# Patient Record
Sex: Female | Born: 1950 | Race: White | Hispanic: No | Marital: Married | Smoking: Former smoker
Health system: Southern US, Community
[De-identification: ages and names within clinical notes are randomized; demographics above are authoritative.]

## PROBLEM LIST (undated history)

## (undated) DIAGNOSIS — M5126 Other intervertebral disc displacement, lumbar region: Secondary | ICD-10-CM

## (undated) DIAGNOSIS — R51 Headache: Secondary | ICD-10-CM

## (undated) DIAGNOSIS — R0602 Shortness of breath: Secondary | ICD-10-CM

## (undated) DIAGNOSIS — M1990 Unspecified osteoarthritis, unspecified site: Secondary | ICD-10-CM

## (undated) DIAGNOSIS — R0789 Other chest pain: Secondary | ICD-10-CM

## (undated) DIAGNOSIS — I10 Essential (primary) hypertension: Secondary | ICD-10-CM

## (undated) HISTORY — DX: Unspecified osteoarthritis, unspecified site: M19.90

## (undated) HISTORY — DX: Essential (primary) hypertension: I10

## (undated) HISTORY — DX: Other intervertebral disc displacement, lumbar region: M51.26

## (undated) HISTORY — PX: SPINE SURGERY: SHX786

## (undated) HISTORY — PX: COLONOSCOPY: SHX174

## (undated) HISTORY — DX: Other chest pain: R07.89

---

## 1997-08-16 ENCOUNTER — Other Ambulatory Visit: Admission: RE | Admit: 1997-08-16 | Discharge: 1997-08-16 | Payer: Self-pay | Admitting: Obstetrics and Gynecology

## 2000-03-12 ENCOUNTER — Encounter: Payer: Self-pay | Admitting: Internal Medicine

## 2000-03-12 ENCOUNTER — Encounter: Admission: RE | Admit: 2000-03-12 | Discharge: 2000-03-12 | Payer: Self-pay | Admitting: Internal Medicine

## 2001-03-13 ENCOUNTER — Encounter: Payer: Self-pay | Admitting: Internal Medicine

## 2001-03-13 ENCOUNTER — Encounter: Admission: RE | Admit: 2001-03-13 | Discharge: 2001-03-13 | Payer: Self-pay | Admitting: Internal Medicine

## 2002-03-25 ENCOUNTER — Encounter: Payer: Self-pay | Admitting: Internal Medicine

## 2002-03-25 ENCOUNTER — Encounter: Admission: RE | Admit: 2002-03-25 | Discharge: 2002-03-25 | Payer: Self-pay | Admitting: Internal Medicine

## 2003-03-29 ENCOUNTER — Encounter: Admission: RE | Admit: 2003-03-29 | Discharge: 2003-03-29 | Payer: Self-pay | Admitting: Internal Medicine

## 2005-09-12 LAB — PULMONARY FUNCTION TEST

## 2006-09-09 ENCOUNTER — Ambulatory Visit: Payer: Self-pay | Admitting: Cardiology

## 2006-10-10 ENCOUNTER — Ambulatory Visit: Payer: Self-pay | Admitting: Gastroenterology

## 2006-10-25 ENCOUNTER — Ambulatory Visit: Payer: Self-pay | Admitting: Gastroenterology

## 2006-10-25 ENCOUNTER — Encounter: Payer: Self-pay | Admitting: Gastroenterology

## 2009-03-25 ENCOUNTER — Encounter: Admission: RE | Admit: 2009-03-25 | Discharge: 2009-03-25 | Payer: Self-pay | Admitting: Internal Medicine

## 2009-09-26 ENCOUNTER — Encounter: Payer: Self-pay | Admitting: Cardiology

## 2010-06-13 ENCOUNTER — Encounter: Payer: Self-pay | Admitting: Cardiology

## 2010-06-15 DIAGNOSIS — Z87891 Personal history of nicotine dependence: Secondary | ICD-10-CM | POA: Insufficient documentation

## 2010-06-15 DIAGNOSIS — Z9189 Other specified personal risk factors, not elsewhere classified: Secondary | ICD-10-CM | POA: Insufficient documentation

## 2010-06-15 DIAGNOSIS — M19049 Primary osteoarthritis, unspecified hand: Secondary | ICD-10-CM | POA: Insufficient documentation

## 2010-06-20 ENCOUNTER — Encounter: Payer: Self-pay | Admitting: Cardiology

## 2010-06-20 ENCOUNTER — Ambulatory Visit
Admission: RE | Admit: 2010-06-20 | Discharge: 2010-06-20 | Payer: Self-pay | Source: Home / Self Care | Attending: Cardiology | Admitting: Cardiology

## 2010-06-20 DIAGNOSIS — R0789 Other chest pain: Secondary | ICD-10-CM | POA: Insufficient documentation

## 2010-06-20 DIAGNOSIS — R0602 Shortness of breath: Secondary | ICD-10-CM | POA: Insufficient documentation

## 2010-06-28 ENCOUNTER — Telehealth (INDEPENDENT_AMBULATORY_CARE_PROVIDER_SITE_OTHER): Payer: Self-pay | Admitting: *Deleted

## 2010-06-28 NOTE — Assessment & Plan Note (Signed)
Summary: f3y  Medications Added ALPRAZOLAM 0.5 MG TABS (ALPRAZOLAM) 1/2 tablet at night as needed PRILOSEC 20 MG CPDR (OMEPRAZOLE) Take 1 tablet by mouth two times a day      Allergies Added: ! SULFA ! NAPROSYN  Visit Type:  3 yr follow up Primary Provider:  Dr. Perrin Maltese  CC:  discomfort in neck and chest.  History of Present Illness: [name] comes today with chest and neck tightness and DOE.  The chest discomfort occurs with stress much of which she has experienced lately. A neighbor is sueing and accusing her dogs of killing her pot belly pig.  She has also been having increased DOE which is new. BP has been elevated recently. She also has GERD which is responding to two times a day Prilosec.  EKG from Dr Perrin Maltese office reviewed...no acute changes, essentially normal.  Clinical Reports Reviewed:  Nuclear Study:  07/22/2001:  Interpretation: Clinically negative for ischemia. Electrocardiographically negative for ischemia. Perfusion data does not demonstrate ischemia.   Lewayne Bunting, MD   Current Medications (verified): 1)  Alprazolam 0.5 Mg Tabs (Alprazolam) .... 1/2 Tablet At Night As Needed 2)  Prilosec 20 Mg Cpdr (Omeprazole) .... Take 1 Tablet By Mouth Two Times A Day  Allergies (verified): 1)  ! Sulfa 2)  ! Naprosyn  Past History:  Past Medical History: Last updated: 07/06/2010 CHEST PAIN, ATYPICAL, HX OF (ICD-V15.89) TOBACCO USE, QUIT (ICD-V15.82) ARTHRITIS, HANDS, BILATERAL (ZOX-096.04)  Past Surgical History: Last updated: 2010-07-06 Back Surgery...1996  Family History: Last updated: 07-06-10 Father: deceased @ 62..had CHF also heavy alcohol use  Social History: Last updated: 2010/07/06 Full Time Married  Drug Use - no Tobacco Use - Former.  Alcohol Use - yes...socially  Risk Factors: Smoking Status: quit (Jul 06, 2010)  Review of Systems       negative other than HPI  Vital Signs:  Patient profile:   60 year old female Height:      69  inches Weight:      181.50 pounds BMI:     26.90 Pulse rate:   73 / minute Resp:     18 per minute BP sitting:   128 / 82  (left arm) Cuff size:   large  Vitals Entered By: Celestia Khat, CMA (June 20, 2010 10:17 AM)  Physical Exam  General:  obese.   Head:  normocephalic and atraumatic Eyes:  PERRLA/EOM intact; conjunctiva and lids normal. Neck:  Neck supple, no JVD. No masses, thyromegaly or abnormal cervical nodes. Chest Esdras Delair:  no deformities or breast masses noted Lungs:  Clear bilaterally to auscultation and percussion. Heart:  PMI NORMAL, NL S1 AND S2, RRR, NO CAROTID BRUITS. Abdomen:  SOFT, GOOD BS AND NO BRUIT Msk:  Back normal, normal gait. Muscle strength and tone normal. Pulses:  pulses normal in all 4 extremities Extremities:  No clubbing or cyanosis. Neurologic:  Alert and oriented x 3. Skin:  Intact without lesions or rashes. Psych:  Normal affect.   Problems:  Medical Problems Added: 1)  Dx of Chest Tightness-pressure-other  (ICD-786.59) 2)  Dx of Dyspnea  (ICD-786.05)  Impression & Recommendations:  Problem # 1:  CHEST TIGHTNESS-PRESSURE-OTHER (VWU-981.19) Assessment New  Probably situational stress related plus GERD. With DOE, worried about CAD. Will access with stress myoview.  Problem # 2:  DYSPNEA (ICD-786.05) Assessment: New  Other Orders: EKG w/ Interpretation (93000)  Patient Instructions: 1)  Your physician recommends that you schedule a follow-up appointment in: as needed with Dr. Daleen Squibb 2)  Your physician  recommends that you continue on your current medications as directed. Please refer to the Current Medication list given to you today. 3)  Your physician has requested that you have an exercise stress myoview.  For further information please visit [email or web address]  Please follow instruction sheet, as given.

## 2010-06-29 ENCOUNTER — Ambulatory Visit (HOSPITAL_COMMUNITY): Payer: BC Managed Care – PPO | Attending: Cardiology

## 2010-06-29 ENCOUNTER — Encounter: Payer: Self-pay | Admitting: Internal Medicine

## 2010-06-29 DIAGNOSIS — R079 Chest pain, unspecified: Secondary | ICD-10-CM | POA: Insufficient documentation

## 2010-06-29 DIAGNOSIS — I491 Atrial premature depolarization: Secondary | ICD-10-CM

## 2010-06-29 DIAGNOSIS — R0609 Other forms of dyspnea: Secondary | ICD-10-CM

## 2010-06-29 DIAGNOSIS — I4949 Other premature depolarization: Secondary | ICD-10-CM

## 2010-06-29 DIAGNOSIS — R0789 Other chest pain: Secondary | ICD-10-CM

## 2010-06-30 ENCOUNTER — Encounter: Payer: Self-pay | Admitting: Internal Medicine

## 2010-07-06 NOTE — Assessment & Plan Note (Addendum)
Summary: Cardiology Nuclear Testing  Nuclear Med Background Indications for Stress Test: Evaluation for Ischemia   History: Myocardial Perfusion Study  History Comments: '03 ZOX:WRUEAV, EF=69%  Symptoms: Chest Tightness, DOE, Rapid HR  Symptoms Comments: CP>neck with stress. Last episode of CP:2 weeks ago.   Nuclear Pre-Procedure Cardiac Risk Factors: Family History - CAD, History of Smoking Caffeine/Decaff Intake: None Lungs: Clear IV 0.9% NS with Angio Cath: 22g     IV Site: R Hand IV Started by: Bonnita Levan, RN Chest Size (in) 36     Cup Size C     Height (in): 69 Weight (lb): 182 BMI: 26.97  Nuclear Med Study 1 or 2 day study:  1 day     Stress Test Type:  Stress Reading MD:  Kristeen Miss, MD     Referring MD:  Valera Castle, MD Resting Radionuclide:  Technetium 21m Tetrofosmin     Resting Radionuclide Dose:  11 mCi  Stress Radionuclide:  Technetium 67m Tetrofosmin     Stress Radionuclide Dose:  33 mCi   Stress Protocol Exercise Time (min):  8:15 min     Max HR:  162 bpm     Predicted Max HR:  159 bpm  Max Systolic BP: 201 mm Hg     Percent Max HR:  101.89 %     METS: 10.4 Rate Pressure Product:  40981    Stress Test Technologist:  Rea College, CMA-N     Nuclear Technologist:  Doyne Keel, CNMT  Rest Procedure  Myocardial perfusion imaging was performed at rest 45 minutes following the intravenous administration of Technetium 54m Tetrofosmin.  Stress Procedure  The patient exercised for 8:15 on the treadmill utilizing the Bruce protocol.  The patient stopped due to fatigue and dyspnea.  She denied any chest pain.  There were nonspecific ST-T wave changes, occasional PVC's and PAC's.  She had a hypertensive response to exercise, 200/122.  Technetium 52m Tetrofosmin was injected at peak exercise and myocardial perfusion imaging was performed after a brief delay.  QPS Raw Data Images:  There is a breast shadow that accounts for the anterior attenuation. Stress  Images:  There is decreased uptake in the anterior wall. Rest Images:  There is decreased uptake in the anterior wall. Subtraction (SDS):  There is a fixed anterior defect that is most consistent with breast attenuation. Transient Ischemic Dilatation:  1.05  (Normal <1.22)  Lung/Heart Ratio:  0.26  (Normal <0.45)  Quantitative Gated Spect Images QGS EDV:  69 ml QGS ESV:  23 ml QGS EF:  67 % QGS cine images:  Normal  Findings Normal nuclear study      Overall Impression  Exercise Capacity: Fair exercise capacity. BP Response: Hypertensive blood pressure response. Clinical Symptoms: There is dyspnea. ECG Impression: Non-specific upsloping ST depression Overall Impression: Normal stress nuclear study.  Appended Document: Cardiology Nuclear Testing BP very high with exertion. No ischemia. Begin amlodopine 2.5 mg a day. See if improves chest pain and DOE.  Appended Document: Cardiology Nuclear Testing Pt aware of results and medication.  She would like to know if her pcp is to follow-up regarding her blood pressure. Mylo Red RN   Clinical Lists Changes  Medications: Added new medication of AMLODIPINE BESYLATE 2.5 MG TABS (AMLODIPINE BESYLATE) Take one tablet by mouth daily - Signed Rx of AMLODIPINE BESYLATE 2.5 MG TABS (AMLODIPINE BESYLATE) Take one tablet by mouth daily;  #30 x 3;  Signed;  Entered by: Lisabeth Devoid RN;  Authorized by: Jesse Sans  Daleen Squibb, MD, [facility];  Method used: [id number] [address], [place], [place]  [address], Ph: [phone], Fax: [phone]    Prescriptions: AMLODIPINE BESYLATE 2.5 MG TABS (AMLODIPINE BESYLATE) Take one tablet by mouth daily  #30 x 3   Entered by:   Lisabeth Devoid RN   Authorized by:   Gaylord Shih, MD, [facility]   Signed by:   Lisabeth Devoid RN on 07/04/2010   Method used:   [facility]ronically to        [id number]       [address]       [place], [place]  [address]       Ph:  [phone]       Fax: [phone]   RxID:   [id number]    Appended Document: Cardiology Nuclear Testing I spoke with an extremely upset pt b/c I was unable to call her back yesterday to completely answer all of her questions regarding new medication. She was also upset that I was interrupted while talking with her on 2/14 about her results as well as the fact that she waited all day(2/15) with the phone "attached to my hip" for a call.  I have listened to her today as she states [facility] instructed her not to start amlodipine as she was going out of town today for the birth of her grandchild.  She was further instructed by [facility] pharmacist of all side effects and pt is hesitant to start medication.  Pt would like [place] also to know that her leg is sore from the stress test.  She is clearly upset with me for not being able to call her back yesterdayand said that this was poor customer service. I have tried to resolve and reassure pt to no avail.  She will keep a bp log prior to starting amlodipine. I asked her to bring her bp log in for Dr. Daleen Squibb to review. She understands it may also help her chest discomfort and sob but does not want to start it away from home as she is VERY sensitive to medications. She will continue her Prilosec as we reviewed these are 2 different medications. She will also follow-up with Dr. Perrin Maltese. Pt would like copy of stress test sent to her home and to Dr. Perrin Maltese. She would like to know when to follow-up with Dr. Daleen Squibb or whoever about her bp.  I offered to make her an appt. but she is on the road at this time. All questions answered to the best of my ability at this time. Mylo Red RN  Appended Document: Cardiology Nuclear Testing SHE CAN FOLLOWUP WITH DR Perrin Maltese. SEND TEST RESULTS TO HER AND GUEST. NO FOLLOWUP WITH ME OR CARDIOLOGY INDICATED.

## 2010-07-06 NOTE — Progress Notes (Signed)
Summary: Nuc Pre-Procedure  Phone Note Outgoing Call Call back at [facility] Phone [phone]   Call placed by: Antionette Char RN,  June 28, 2010 3:49 PM Call placed to: Patient Summary of Call: Reviewed information on Myoview Information Sheet (see scanned document for further details).  Spoke with patient.     Nuclear Med Background Indications for Stress Test: Evaluation for Ischemia   History: Myocardial Perfusion Study  History Comments: 2003 Nml  Symptoms: Chest Tightness, DOE    Nuclear Pre-Procedure Cardiac Risk Factors: Family History - CAD, History of Smoking Height (in): 69

## 2010-08-01 NOTE — Letter (Signed)
Summary: [facility]   Imported By: Kassie Mends 07/26/2010 09:38:48  _____________________________________________________________________  External Attachment:    Type:   Image     Comment:   External Document

## 2010-10-06 NOTE — Assessment & Plan Note (Signed)
[facility]                            CARDIOLOGY OFFICE NOTE   JOSSLIN, SANJUAN                       MRN:          [id number]  DATE:09/09/2006                            DOB:          Feb 06, 1951    Ms. Tanetta Fuhriman is a 60 year old married white female, referred by Dr.  Perrin Maltese for a consult for proactive preventative care.   She is a delightful 60 year old white female who is plagued by a history  of her father dying of congestive heart failure at age 66.  He was  diagnosed in 1961.  Apparently this was non-ischemic.  He was somewhat  of a heavy drinker in his later years and this could have been part of  the problem.   She saw Dr. Jens Som for this and some atypical chest pain in 2003.  A  stress Myoview showed good exercise tolerance, adequate blood pressure  response to exercise, met level T at 10.1, EF of 69% and normal  perfusion and wall motion analysis.   She now describes a tightness in her chest that goes up to the base of  her neck.  This occurs most commonly when she is stressed or anxious.  She is very type A and driven.  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   When she walks, which she does on a regular basis, she has no chest  tightness or pressure.  She has no other ischemic symptoms.   PAST MEDICAL HISTORY:  Significant for tobacco use, which she quit 9  years ago when she was diagnosed with pneumonia.  She occasionally  drinks some alcohol socially, but is not a heavy drinker.  Her  cholesterol level was down in the dirt in April 2008.  She does not  have hypertension or diabetes.  She really does not have a premature  history of coronary disease.   PAST SURGICAL HISTORY:  She had back surgery in 1996.   MEDICATIONS:  Her only meds are Tylenol as needed, multivitamins.   SOCIAL HISTORY:  She works for a Investment banker, operational, Optician, dispensing to stores, etc.  She is married and has 2  children.   REVIEW OF SYSTEMS:  Was totally negative other than some chronic  arthritis, particularly in her hands.  The rest of her review of systems  were reviewed are negative.   PHYSICAL EXAMINATION:  VITAL SIGNS:  Her blood pressure is 128/71, her  pulse is 88 and regular.  She is 5 foot, 9 inches and weighs 164 pounds.  HEENT:  Normocephalic.  Atraumatic.  PERRLA.  Extraocular movements are  intact.  Sclerae is clear.  Facial asymmetry is normal.  NECK:  Carotid upstrokes were equal bilaterally without bruits.  There  is no JVD.  Thyroid is not enlarged.  Neck is supple.  CARDIAC:  PMI is nondisplaced. She has a normal S1, S2 without murmur or  gallop.  LUNGS:  Clear to auscultation and percussion.  ABDOMEN:  Soft, good bowel sounds.  There  is no midline tenderness, no  bruit.  There is no organomegaly.  EXTREMITIES:  There is no cyanosis, clubbing or edema.  Pedal pulses are  intact.  NEURO:  Intact.  SKIN:  Intact.   EKG from Dr. Ernestene Mention office is completely normal.   ASSESSMENT AND PLAN:  I had about a 30 minute discussion with Mrs.  Leffel.  She is a delightful lady and I am very appreciative of her  proactive stance; however, I think this is probably stress/tension and  is not secondary to any ischemic heart disease.  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   Her Framingham risk is about 5% or less over the next 10 years.   We talked again about reinforcing all of the right behaviors for  reducing her risk of future cardiac or cerebrovascular problems.  We  also talked about symptoms of exertional angina as well as unstable  angina or acute coronary syndrome and how to respond appropriately.  I  will see her back on an as needed basis.     Thomas C. Daleen Squibb, MD, [facility]  Electronically Signed    TCW/MedQ  DD: 09/09/2006  DT: 09/10/2006  Job #: [id number]   cc:   Jonita Albee, M.D.

## 2011-02-19 ENCOUNTER — Encounter: Payer: Self-pay | Admitting: Cardiology

## 2011-02-20 ENCOUNTER — Ambulatory Visit (INDEPENDENT_AMBULATORY_CARE_PROVIDER_SITE_OTHER): Payer: BC Managed Care – PPO | Admitting: Cardiology

## 2011-02-20 ENCOUNTER — Encounter: Payer: Self-pay | Admitting: Cardiology

## 2011-02-20 VITALS — BP 124/80 | HR 100 | Ht 69.0 in | Wt 186.0 lb

## 2011-02-20 DIAGNOSIS — R0789 Other chest pain: Secondary | ICD-10-CM

## 2011-02-20 DIAGNOSIS — R0602 Shortness of breath: Secondary | ICD-10-CM

## 2011-02-20 DIAGNOSIS — I10 Essential (primary) hypertension: Secondary | ICD-10-CM

## 2011-02-20 MED ORDER — DILTIAZEM HCL ER 180 MG PO CP24: 180.0000 mg | ORAL_CAPSULE | Freq: Every day | ORAL | Status: DC

## 2011-02-20 NOTE — Progress Notes (Signed)
HPI Brooke Sullivan returns today for evaluation and management of her chest tightness, recent negative stress Myoview, and hypertension.  She's under a lot of stress with her mother being ill as well as her mother-in-law. She  Has a difficult time sleeping. She  Eels her heart pounding in her neck when she lays down on her left side.  She gets emotional or excited she starts getting chest tightness.  On her stress Myoview her left ventricular function was normal, no ischemia, relatively good exercise tolerance, but she had an exaggerated blood pressure response to 200 systolic.  EKG was not repeated today. Past Medical History  Diagnosis Date  . Chest pain, atypical   . Arthritis     hands bilateral    No past surgical history on file.  Family History  Problem Relation Age of Onset  . Heart failure Father 74    died. also heavy alcohol use    History   Social History  . Marital Status: Single    Spouse Name: N/A    Number of Children: N/A  . Years of Education: N/A   Occupational History  . Not on file.   Social History Main Topics  . Smoking status: Former Games developer  . Smokeless tobacco: Not on file  . Alcohol Use: Yes     socially  . Drug Use: No  . Sexually Active: Not on file   Other Topics Concern  . Not on file   Social History Narrative  . No narrative on file    Allergies  Allergen Reactions  . Naproxen   . Sulfonamide Derivatives     Current Outpatient Prescriptions  Medication Sig Dispense Refill  . omeprazole (PRILOSEC) 20 MG capsule Take 20 mg by mouth as needed.       . diltiazem (DILACOR XR) 180 MG 24 hr capsule Take 1 capsule (180 mg total) by mouth at bedtime.  30 capsule  11    ROS Negative other than HPI.   PE General Appearance: well developed, well nourished in no acute distress HEENT: symmetrical face, PERRLA, good dentition  Neck: no JVD, thyromegaly, or adenopathy, trachea midline Chest: symmetric without deformity Cardiac: PMI  non-displaced, RRR, normal S1, S2, no gallop or murmur Lung: clear to ausculation and percussion Vascular: all pulses full without bruits  Abdominal: nondistended, nontender, good bowel sounds, no HSM, no bruits Extremities: no cyanosis, clubbing or edema, no sign of DVT, no varicosities  Skin: normal color, no rashes Neuro: alert and oriented x 3, non-focal Pysch: normal affect Filed Vitals:   02/20/11 1500  BP: 124/80  Pulse: 100  Height: 5\' 9"  (1.753 m)  Weight: 186 lb (84.369 kg)    EKG  Labs and Studies Reviewed.   No results found for this basename: WBC, HGB, HCT, MCV, PLT      Chemistry   No results found for this basename: NA, K, CL, CO2, BUN, CREATININE, GLU   No results found for this basename: CALCIUM, ALKPHOS, AST, ALT, BILITOT       No results found for this basename: CHOL   No results found for this basename: HDL   No results found for this basename: LDLCALC   No results found for this basename: TRIG   No results found for this basename: CHOLHDL   No results found for this basename: HGBA1C   No results found for this basename: ALT, AST, GGT, ALKPHOS, BILITOT   No results found for this basename: TSH

## 2011-02-20 NOTE — Patient Instructions (Signed)
Your physician has recommended you make the following change in your medication:   Stop Amlodipine Start Diltiazem at bedtime   Your physician recommends that you schedule a follow-up appointment as needed with Dr. Daleen Squibb

## 2011-02-20 NOTE — Assessment & Plan Note (Signed)
This is cardiac. Recent stress test  reviewed. I changed her amlodipine to diltiazem extended release 180 mg a day to hopefully decrease her palpitations and resting heart rate a bit. I will see her back on a p.r.n. Basis.

## 2011-02-21 ENCOUNTER — Telehealth: Payer: Self-pay | Admitting: Cardiology

## 2011-02-21 NOTE — Telephone Encounter (Signed)
Patient calling wants to discuss medication - diltiazem. Pt was seen on yesterday. Pt haven't sleep last night.

## 2011-02-21 NOTE — Telephone Encounter (Signed)
Pt started taking Diltiazem last night at 9:45pm. By 12mn she felt her pulse racing and "I have not slept since".  "Pill number 1 did not do what it was supposed to do!" "It made my heart race worse!". Her pharmacist suggested that she take the Amlodipine today and tomorrow ( she is going out of town).  Then try the Diltiazem again on Friday.   She will call back on Friday and give an update.  " I have already wasted money on this". She would like samples if possible.  I told her if her medication was changed again & it was generic we would not have any samples. Pt understands. Mylo Red RN

## 2011-02-26 ENCOUNTER — Telehealth: Payer: Self-pay | Admitting: *Deleted

## 2011-02-26 MED ORDER — AMLODIPINE BESYLATE 2.5 MG PO TABS: 2.5000 mg | ORAL_TABLET | Freq: Every day | ORAL | Status: DC

## 2011-02-26 NOTE — Telephone Encounter (Signed)
Pt will continue taking amlodipine.  She was not able to tolerate Diltiazem.  She is aware Dr. Daleen Squibb is out of town this week. She would like to know what he thinks about her taking  the amlodipine twice a day.

## 2011-06-27 ENCOUNTER — Telehealth: Payer: Self-pay | Admitting: Physician Assistant

## 2011-06-27 DIAGNOSIS — G4700 Insomnia, unspecified: Secondary | ICD-10-CM

## 2011-06-27 MED ORDER — ZOLPIDEM TARTRATE 10 MG PO TABS: 10.0000 mg | ORAL_TABLET | Freq: Every evening | ORAL | Status: DC | PRN

## 2011-06-27 NOTE — Telephone Encounter (Signed)
Patient's pharmacy faxed a refill request for Ambien 10 mg. Refilled x 1  Brooke Sullivan

## 2011-08-07 ENCOUNTER — Telehealth: Payer: Self-pay

## 2011-08-07 NOTE — Telephone Encounter (Signed)
Written prescription done.  It has to be 5mg  now according to FDA

## 2011-08-07 NOTE — Telephone Encounter (Signed)
Dr Milus Glazier, pt is calling to check on her Ambien RF that pharmacy faxed [place]. She is completely out and said pharmacy faxed Fri and then again yesterday. Alycia Rossetti had addressed and denied by fax this morning d/t pt needing OV, but did not know this when I spoke with pt. Do you want to see her first, or give her another RF until she can get in?

## 2011-08-07 NOTE — Telephone Encounter (Signed)
Called pt and let her know that she is due for f/up but we are calling in #10 of 5 mg Ambien until she can get in. She said she is coming to see Dr Perrin Maltese tomorrow anyway. [facility] [address], [place]

## 2011-08-08 ENCOUNTER — Ambulatory Visit (INDEPENDENT_AMBULATORY_CARE_PROVIDER_SITE_OTHER): Payer: BC Managed Care – PPO | Admitting: Internal Medicine

## 2011-08-08 VITALS — BP 146/80 | HR 76 | Temp 97.5°F | Resp 18 | Ht 68.5 in | Wt 188.2 lb

## 2011-08-08 DIAGNOSIS — Z0000 Encounter for general adult medical examination without abnormal findings: Secondary | ICD-10-CM

## 2011-08-08 DIAGNOSIS — F419 Anxiety disorder, unspecified: Secondary | ICD-10-CM

## 2011-08-08 DIAGNOSIS — G4700 Insomnia, unspecified: Secondary | ICD-10-CM

## 2011-08-08 DIAGNOSIS — Z79899 Other long term (current) drug therapy: Secondary | ICD-10-CM

## 2011-08-08 DIAGNOSIS — I10 Essential (primary) hypertension: Secondary | ICD-10-CM

## 2011-08-08 LAB — POCT CBC
Lymph, poc: 1.5 (ref 0.6–3.4)
MCH, POC: 29.8 pg (ref 27–31.2)
MCHC: 32.9 g/dL (ref 31.8–35.4)
MID (cbc): 0.5 (ref 0–0.9)
MPV: 8.6 fL (ref 0–99.8)
POC Granulocyte: 3.2 (ref 2–6.9)
POC MID %: 8.7 %M (ref 0–12)
Platelet Count, POC: 340 10*3/uL (ref 142–424)
WBC: 5.2 10*3/uL (ref 4.6–10.2)

## 2011-08-08 LAB — LIPID PANEL
Cholesterol: 240 mg/dL — ABNORMAL HIGH (ref 0–200)
HDL: 78 mg/dL (ref >39)
LDL Cholesterol: 147 mg/dL — ABNORMAL HIGH (ref 0–99)
Total CHOL/HDL Ratio: 3.1 Ratio
Triglycerides: 75 mg/dL (ref <150)
VLDL: 15 mg/dL (ref 0–40)

## 2011-08-08 LAB — COMPREHENSIVE METABOLIC PANEL
ALT: 19 U/L (ref 0–35)
AST: 18 U/L (ref 0–37)
Albumin: 4.5 g/dL (ref 3.5–5.2)
Alkaline Phosphatase: 90 U/L (ref 39–117)
BUN: 12 mg/dL (ref 6–23)
CO2: 25 mEq/L (ref 19–32)
Calcium: 9.7 mg/dL (ref 8.4–10.5)
Chloride: 105 mEq/L (ref 96–112)
Creat: 0.65 mg/dL (ref 0.50–1.10)
Glucose, Bld: 91 mg/dL (ref 70–99)
Potassium: 4.3 mEq/L (ref 3.5–5.3)
Sodium: 140 mEq/L (ref 135–145)
Total Bilirubin: 0.7 mg/dL (ref 0.3–1.2)
Total Protein: 6.8 g/dL (ref 6.0–8.3)

## 2011-08-08 LAB — POCT UA - MICROSCOPIC ONLY
Bacteria, U Microscopic: NEGATIVE
Epithelial cells, urine per micros: NEGATIVE
Mucus, UA: NEGATIVE
RBC, urine, microscopic: NEGATIVE
WBC, Ur, HPF, POC: NEGATIVE

## 2011-08-08 LAB — POCT URINALYSIS DIPSTICK
Blood, UA: NEGATIVE
Leukocytes, UA: NEGATIVE
Protein, UA: NEGATIVE
Urobilinogen, UA: 0.2
pH, UA: 7.5

## 2011-08-08 MED ORDER — ZOLPIDEM TARTRATE 5 MG PO TABS: 5.0000 mg | ORAL_TABLET | Freq: Every evening | ORAL | Status: DC | PRN

## 2011-08-08 MED ORDER — AMLODIPINE BESYLATE 2.5 MG PO TABS: 2.5000 mg | ORAL_TABLET | Freq: Every day | ORAL | Status: DC

## 2011-08-08 NOTE — Patient Instructions (Signed)
Insomnia Insomnia is frequent trouble falling and/or staying asleep. Insomnia can be a long term problem or a short term problem. Both are common. Insomnia can be a short term problem when the wakefulness is related to a certain stress or worry. Long term insomnia is often related to ongoing stress during waking hours and/or poor sleeping habits. Overtime, sleep deprivation itself can make the problem worse. Every little thing feels more severe because you are overtired and your ability to cope is decreased. CAUSES   Stress, anxiety, and depression.   Poor sleeping habits.   Distractions such as TV in the bedroom.   Naps close to bedtime.   Engaging in emotionally charged conversations before bed.   Technical reading before sleep.   Alcohol and other sedatives. They may make the problem worse. They can hurt normal sleep patterns and normal dream activity.   Stimulants such as caffeine for several hours prior to bedtime.   Pain syndromes and shortness of breath can cause insomnia.   Exercise late at night.   Changing time zones may cause sleeping problems (jet lag).  It is sometimes helpful to have someone observe your sleeping patterns. They should look for periods of not breathing during the night (sleep apnea). They should also look to see how long those periods last. If you live alone or observers are uncertain, you can also be observed at a sleep clinic where your sleep patterns will be professionally monitored. Sleep apnea requires a checkup and treatment. Give your caregivers your medical history. Give your caregivers observations your family has made about your sleep.  SYMPTOMS   Not feeling rested in the morning.   Anxiety and restlessness at bedtime.   Difficulty falling and staying asleep.  TREATMENT   Your caregiver may prescribe treatment for an underlying medical disorders. Your caregiver can give advice or help if you are using alcohol or other drugs for  self-medication. Treatment of underlying problems will usually eliminate insomnia problems.   Medications can be prescribed for short time use. They are generally not recommended for lengthy use.   Over-the-counter sleep medicines are not recommended for lengthy use. They can be habit forming.   You can promote easier sleeping by making lifestyle changes such as:   Using relaxation techniques that help with breathing and reduce muscle tension.   Exercising earlier in the day.   Changing your diet and the time of your last meal. No night time snacks.   Establish a regular time to go to bed.   Counseling can help with stressful problems and worry.   Soothing music and white noise may be helpful if there are background noises you cannot remove.   Stop tedious detailed work at least one hour before bedtime.  HOME CARE INSTRUCTIONS   Keep a diary. Inform your caregiver about your progress. This includes any medication side effects. See your caregiver regularly. Take note of:   Times when you are asleep.   Times when you are awake during the night.   The quality of your sleep.   How you feel the next day.  This information will help your caregiver care for you.  Get out of bed if you are still awake after 15 minutes. Read or do some quiet activity. Keep the lights down. Wait until you feel sleepy and go back to bed.   Keep regular sleeping and waking hours. Avoid naps.   Exercise regularly.   Avoid distractions at bedtime. Distractions include watching television or engaging   in any intense or detailed activity like attempting to balance the household checkbook.   Develop a bedtime ritual. Keep a familiar routine of bathing, brushing your teeth, climbing into bed at the same time each night, listening to soothing music. Routines increase the success of falling to sleep faster.   Use relaxation techniques. This can be using breathing and muscle tension release routines. It can  also include visualizing peaceful scenes. You can also help control troubling or intruding thoughts by keeping your mind occupied with boring or repetitive thoughts like the old concept of counting sheep. You can make it more creative like imagining planting one beautiful flower after another in your backyard garden.   During your day, work to eliminate stress. When this is not possible use some of the previous suggestions to help reduce the anxiety that accompanies stressful situations.  MAKE SURE YOU:   Understand these instructions.   Will watch your condition.   Will get help right away if you are not doing well or get worse.  Document Released: 05/04/2000 Document Revised: 04/26/2011 Document Reviewed: 06/04/2007 [employer] Patient Information 2012 [employer].Hypertension As your heart beats, it forces blood through your arteries. This force is your blood pressure. If the pressure is too high, it is called hypertension (HTN) or high blood pressure. HTN is dangerous because you may have it and not know it. High blood pressure may mean that your heart has to work harder to pump blood. Your arteries may be narrow or stiff. The extra work puts you at risk for heart disease, stroke, and other problems.  Blood pressure consists of two numbers, a higher number over a lower, 110/72, for example. It is stated as "110 over 72." The ideal is below 120 for the top number (systolic) and under 80 for the bottom (diastolic). Write down your blood pressure today. You should pay close attention to your blood pressure if you have certain conditions such as:  Heart failure.   Prior heart attack.   Diabetes   Chronic kidney disease.   Prior stroke.   Multiple risk factors for heart disease.  To see if you have HTN, your blood pressure should be measured while you are seated with your arm held at the level of the heart. It should be measured at least twice. A one-time elevated blood pressure reading  (especially in the Emergency Department) does not mean that you need treatment. There may be conditions in which the blood pressure is different between your right and left arms. It is important to see your caregiver soon for a recheck. Most people have essential hypertension which means that there is not a specific cause. This type of high blood pressure may be lowered by changing lifestyle factors such as:  Stress.   Smoking.   Lack of exercise.   Excessive weight.   Drug/tobacco/alcohol use.   Eating less salt.  Most people do not have symptoms from high blood pressure until it has caused damage to the body. Effective treatment can often prevent, delay or reduce that damage. TREATMENT  When a cause has been identified, treatment for high blood pressure is directed at the cause. There are a large number of medications to treat HTN. These fall into several categories, and your caregiver will help you select the medicines that are best for you. Medications may have side effects. You should review side effects with your caregiver. If your blood pressure stays high after you have made lifestyle changes or started on medicines,     Your medication(s) may need to be changed.   Other problems may need to be addressed.   Be certain you understand your prescriptions, and know how and when to take your medicine.   Be sure to follow up with your caregiver within the time frame advised (usually within two weeks) to have your blood pressure rechecked and to review your medications.   If you are taking more than one medicine to lower your blood pressure, make sure you know how and at what times they should be taken. Taking two medicines at the same time can result in blood pressure that is too low.  SEEK IMMEDIATE MEDICAL CARE IF:  You develop a severe headache, blurred or changing vision, or confusion.   You have unusual weakness or numbness, or a faint feeling.   You have severe chest or  abdominal pain, vomiting, or breathing problems.  MAKE SURE YOU:   Understand these instructions.   Will watch your condition.   Will get help right away if you are not doing well or get worse.  Document Released: 05/07/2005 Document Revised: 04/26/2011 Document Reviewed: 12/26/2007 Exit[employer] Patient Information 2012 [employer]. 

## 2011-08-08 NOTE — Progress Notes (Signed)
  Subjective:    Patient ID: Brooke Sullivan, female    DOB: July 23, 1950, 61 y.o.   MRN: [id number]  HPI CPE, does not want paps today. Feels good. See scanned hx. HTN stable and controlled No chest pain.   Review of Systems See scanned ROS    Objective:   Physical Exam  Constitutional: She is oriented to person, place, and time. She appears well-developed and well-nourished.  HENT:  Head: Normocephalic.  Eyes: EOM are normal. Pupils are equal, round, and reactive to light.  Neck: Normal range of motion. Neck supple. No thyromegaly present.  Cardiovascular: Normal rate, regular rhythm and normal heart sounds.   Pulmonary/Chest: Effort normal and breath sounds normal.  Abdominal: Soft. Bowel sounds are normal. She exhibits no mass. There is no tenderness.  Musculoskeletal: Normal range of motion.  Lymphadenopathy:    She has no cervical adenopathy.  Neurological: She is alert and oriented to person, place, and time. She has normal reflexes. No cranial nerve deficit. Coordination normal.  Skin: Skin is warm and dry. No erythema.  Psychiatric: She has a normal mood and affect.   No results found for this or any previous visit.        Assessment & Plan:  Htn controlled Refill meds 1 year Insomnia controlled Do paps next year

## 2011-08-14 ENCOUNTER — Telehealth: Payer: Self-pay

## 2011-08-22 NOTE — Telephone Encounter (Signed)
Pts questions answered.  

## 2012-02-14 ENCOUNTER — Encounter (HOSPITAL_COMMUNITY): Payer: Self-pay | Admitting: Pharmacy Technician

## 2012-02-15 ENCOUNTER — Encounter (HOSPITAL_COMMUNITY): Payer: Self-pay

## 2012-02-15 ENCOUNTER — Encounter (HOSPITAL_COMMUNITY)
Admission: RE | Admit: 2012-02-15 | Discharge: 2012-02-15 | Disposition: A | Discharge status: Home / Self Care | Payer: BC Managed Care – PPO | Source: Ambulatory Visit | Attending: Orthopedic Surgery | Admitting: Orthopedic Surgery

## 2012-02-15 HISTORY — DX: Headache: R51

## 2012-02-15 HISTORY — DX: Shortness of breath: R06.02

## 2012-02-15 LAB — CBC
HCT: 42.6 % (ref 36.0–46.0)
Hemoglobin: 14.1 g/dL (ref 12.0–15.0)
MCV: 89.9 fL (ref 78.0–100.0)
RBC: 4.74 MIL/uL (ref 3.87–5.11)
WBC: 6.5 10*3/uL (ref 4.0–10.5)

## 2012-02-15 LAB — BASIC METABOLIC PANEL
CO2: 29 mEq/L (ref 19–32)
Chloride: 101 mEq/L (ref 96–112)
Sodium: 139 mEq/L (ref 135–145)

## 2012-02-15 LAB — SURGICAL PCR SCREEN: Staphylococcus aureus: NEGATIVE

## 2012-02-15 NOTE — Pre-Procedure Instructions (Signed)
20 Brooke Sullivan  02/15/2012   Your procedure is scheduled on:  Friday, October 4th  Report to [facility] at 5:30 AM.  Call this number if you have problems the morning of surgery: [phone]   Remember:   Do not eat food or drink any liquid:After Midnight.     Take these medicines the morning of surgery with A SIP OF WATER: Amlodipine (Norvasc), Omeprazole (Prilosec).    Do not wear jewelry, make-up or nail polish.  Do not wear lotions, powders, or perfumes. You may wear deodorant.  Do not shave 48 hours prior to surgery. Men may shave face and neck.  Do not bring valuables to the hospital.  Contacts, dentures or bridgework may not be worn into surgery.  Leave suitcase in the car. After surgery it may be brought to your room.  For patients admitted to the hospital, checkout time is 11:00 AM the day of discharge.   Patients discharged the day of surgery will not be allowed to drive home.  Name and phone number of your driver:  Special Instructions: Incentive Spirometry - Practice and bring it with you on the day of surgery. Shower using CHG 2 nights before surgery and the night before surgery.  If you shower the day of surgery use CHG.  Use special wash - you have one bottle of CHG for all showers.  You should use approximately 1/3 of the bottle for each shower.   Please read over the following fact sheets that you were given: Pain Booklet, Coughing and Deep Breathing and Surgical Site Infection Prevention

## 2012-02-19 NOTE — H&P (Signed)
CC: left knee pain HPI: 61 y/o female with worsening left knee pain due to a meniscal tear. Pt has elected for a knee arthroscopy to decrease pain and increase function PMH: hypertension Social: former smoker, non drinker Allergies: sulfa, naproxen Meds: norvasc, prilosec, motrin, ambien ROS: pain with ambulation otherwise negative ros PE: alert and appropriate 61 y/o female in no acute distress Cranial nerves 2-12 grossly intact Lumbar spine full rom with no tenderness Left knee: trace effusion Moderate medial joint line tenderness Minimally antalgic gait No rashes or edema X-rays: medial and lateral meniscal tears by mri Assessment: left knee pain due to medial and lateral meniscal tears Plan: left knee arthroscopy to decrease pain and increase function

## 2012-02-21 MED ORDER — CEFAZOLIN SODIUM-DEXTROSE 2-3 GM-% IV SOLR
2.0000 g | INTRAVENOUS | Status: AC
  Administered 2012-02-22: 2 g via INTRAVENOUS

## 2012-02-21 NOTE — Progress Notes (Signed)
Call to pt., informed of time change & she states she was already notified by her Dr's office. Reinforced to be here  ([facility])at 0830 tomorrow a.m.

## 2012-02-22 ENCOUNTER — Encounter (HOSPITAL_COMMUNITY): Payer: Self-pay | Admitting: Anesthesiology

## 2012-02-22 ENCOUNTER — Encounter (HOSPITAL_COMMUNITY): Payer: Self-pay | Admitting: Surgery

## 2012-02-22 ENCOUNTER — Ambulatory Visit (HOSPITAL_COMMUNITY)
Admission: RE | Admit: 2012-02-22 | Discharge: 2012-02-22 | Disposition: A | Discharge status: Home / Self Care | Payer: BC Managed Care – PPO | Source: Ambulatory Visit | Attending: Orthopedic Surgery | Admitting: Orthopedic Surgery

## 2012-02-22 ENCOUNTER — Encounter (HOSPITAL_COMMUNITY)
Admission: RE | Disposition: A | Discharge status: Home / Self Care | Payer: Self-pay | Source: Ambulatory Visit | Attending: Orthopedic Surgery

## 2012-02-22 ENCOUNTER — Ambulatory Visit (HOSPITAL_COMMUNITY): Payer: BC Managed Care – PPO | Admitting: Anesthesiology

## 2012-02-22 DIAGNOSIS — M2340 Loose body in knee, unspecified knee: Secondary | ICD-10-CM | POA: Insufficient documentation

## 2012-02-22 DIAGNOSIS — M2240 Chondromalacia patellae, unspecified knee: Secondary | ICD-10-CM | POA: Insufficient documentation

## 2012-02-22 DIAGNOSIS — Z01812 Encounter for preprocedural laboratory examination: Secondary | ICD-10-CM | POA: Insufficient documentation

## 2012-02-22 DIAGNOSIS — M23302 Other meniscus derangements, unspecified lateral meniscus, unspecified knee: Secondary | ICD-10-CM | POA: Insufficient documentation

## 2012-02-22 DIAGNOSIS — M23305 Other meniscus derangements, unspecified medial meniscus, unspecified knee: Secondary | ICD-10-CM | POA: Insufficient documentation

## 2012-02-22 DIAGNOSIS — Z01818 Encounter for other preprocedural examination: Secondary | ICD-10-CM | POA: Insufficient documentation

## 2012-02-22 DIAGNOSIS — I10 Essential (primary) hypertension: Secondary | ICD-10-CM | POA: Insufficient documentation

## 2012-02-22 HISTORY — PX: KNEE ARTHROSCOPY: SHX127

## 2012-02-22 SURGERY — ARTHROSCOPY, KNEE
Anesthesia: General | Site: Knee | Laterality: Left | Wound class: Clean

## 2012-02-22 MED ORDER — OXYCODONE HCL 5 MG PO TABS: 5.0000 mg | ORAL_TABLET | Freq: Once | ORAL | Status: DC | PRN

## 2012-02-22 MED ORDER — DEXAMETHASONE SODIUM PHOSPHATE 4 MG/ML IJ SOLN
INTRAMUSCULAR | Status: DC | PRN
  Administered 2012-02-22: 8 mg via INTRAVENOUS

## 2012-02-22 MED ORDER — ONDANSETRON HCL 4 MG/2ML IJ SOLN
INTRAMUSCULAR | Status: DC | PRN
  Administered 2012-02-22: 4 mg via INTRAVENOUS

## 2012-02-22 MED ORDER — BUPIVACAINE-EPINEPHRINE PF 0.25-1:200000 % IJ SOLN
INTRAMUSCULAR | Status: DC | PRN
  Administered 2012-02-22: 30 mL

## 2012-02-22 MED ORDER — CEFAZOLIN SODIUM-DEXTROSE 2-3 GM-% IV SOLR
INTRAVENOUS | Status: AC
  Filled 2012-02-22: qty 50

## 2012-02-22 MED ORDER — KETOROLAC TROMETHAMINE 30 MG/ML IJ SOLN
INTRAMUSCULAR | Status: DC | PRN
  Administered 2012-02-22: 30 mg via INTRAVENOUS

## 2012-02-22 MED ORDER — METHOCARBAMOL 500 MG PO TABS: 500.0000 mg | ORAL_TABLET | Freq: Three times a day (TID) | ORAL | Status: DC | PRN

## 2012-02-22 MED ORDER — OXYCODONE HCL 5 MG/5ML PO SOLN: 5.0000 mg | Freq: Once | ORAL | Status: DC | PRN

## 2012-02-22 MED ORDER — SODIUM CHLORIDE 0.9 % IR SOLN
Status: DC | PRN
  Administered 2012-02-22: 6000 mL

## 2012-02-22 MED ORDER — LACTATED RINGERS IV SOLN
INTRAVENOUS | Status: DC | PRN
  Administered 2012-02-22 (×2): via INTRAVENOUS

## 2012-02-22 MED ORDER — DROPERIDOL 2.5 MG/ML IJ SOLN: 0.6250 mg | INTRAMUSCULAR | Status: DC | PRN

## 2012-02-22 MED ORDER — CHLORHEXIDINE GLUCONATE 4 % EX LIQD: 60.0000 mL | Freq: Once | CUTANEOUS | Status: DC

## 2012-02-22 MED ORDER — PHENYLEPHRINE HCL 10 MG/ML IJ SOLN
INTRAMUSCULAR | Status: DC | PRN
  Administered 2012-02-22: 80 ug via INTRAVENOUS

## 2012-02-22 MED ORDER — FENTANYL CITRATE 0.05 MG/ML IJ SOLN
INTRAMUSCULAR | Status: DC | PRN
  Administered 2012-02-22 (×2): 50 ug via INTRAVENOUS

## 2012-02-22 MED ORDER — HYDROMORPHONE HCL PF 1 MG/ML IJ SOLN
0.2500 mg | INTRAMUSCULAR | Status: DC | PRN
  Administered 2012-02-22 (×2): 0.5 mg via INTRAVENOUS

## 2012-02-22 MED ORDER — PROPOFOL 10 MG/ML IV BOLUS
INTRAVENOUS | Status: DC | PRN
  Administered 2012-02-22: 150 mg via INTRAVENOUS

## 2012-02-22 MED ORDER — LACTATED RINGERS IV SOLN
INTRAVENOUS | Status: DC
  Administered 2012-02-22: 10:00:00 via INTRAVENOUS

## 2012-02-22 MED ORDER — LIDOCAINE HCL (CARDIAC) 20 MG/ML IV SOLN
INTRAVENOUS | Status: DC | PRN
  Administered 2012-02-22: 60 mg via INTRAVENOUS

## 2012-02-22 MED ORDER — OXYCODONE-ACETAMINOPHEN 5-325 MG PO TABS: 1.0000 | ORAL_TABLET | ORAL | Status: DC | PRN

## 2012-02-22 MED ORDER — MIDAZOLAM HCL 5 MG/5ML IJ SOLN
INTRAMUSCULAR | Status: DC | PRN
  Administered 2012-02-22 (×2): 1 mg via INTRAVENOUS

## 2012-02-22 MED ORDER — HYDROMORPHONE HCL PF 1 MG/ML IJ SOLN
INTRAMUSCULAR | Status: AC
  Filled 2012-02-22: qty 1

## 2012-02-22 MED ORDER — BUPIVACAINE-EPINEPHRINE PF 0.25-1:200000 % IJ SOLN
INTRAMUSCULAR | Status: AC
  Filled 2012-02-22: qty 30

## 2012-02-22 SURGICAL SUPPLY — 43 items
BANDAGE ELASTIC 4 VELCRO ST LF (GAUZE/BANDAGES/DRESSINGS) IMPLANT
BANDAGE ELASTIC 6 VELCRO ST LF (GAUZE/BANDAGES/DRESSINGS) ×1 IMPLANT
BANDAGE GAUZE ELAST BULKY 4 IN (GAUZE/BANDAGES/DRESSINGS) ×2 IMPLANT
BLADE CUTTER GATOR 3.5 (BLADE) ×2 IMPLANT
CLOTH BEACON ORANGE TIMEOUT ST (SAFETY) ×2 IMPLANT
CLSR STERI-STRIP ANTIMIC 1/2X4 (GAUZE/BANDAGES/DRESSINGS) ×1 IMPLANT
COVER SURGICAL LIGHT HANDLE (MISCELLANEOUS) ×2 IMPLANT
DRAPE ARTHROSCOPY W/POUCH 114 (DRAPES) ×2 IMPLANT
DRSG EMULSION OIL 3X3 NADH (GAUZE/BANDAGES/DRESSINGS) ×1 IMPLANT
DRSG PAD ABDOMINAL 8X10 ST (GAUZE/BANDAGES/DRESSINGS) ×1 IMPLANT
DURAPREP 26ML APPLICATOR (WOUND CARE) ×2 IMPLANT
ELECT MENISCUS 165MM 90D (ELECTRODE) IMPLANT
ELECT REM PT RETURN 9FT ADLT (ELECTROSURGICAL) ×2
ELECTRODE REM PT RTRN 9FT ADLT (ELECTROSURGICAL) ×1 IMPLANT
GAUZE SPONGE 4X4 16PLY XRAY LF (GAUZE/BANDAGES/DRESSINGS) ×2 IMPLANT
GLOVE BIOGEL PI ORTHO PRO 7.5 (GLOVE)
GLOVE BIOGEL PI ORTHO PRO SZ7 (GLOVE) ×1
GLOVE BIOGEL PI ORTHO PRO SZ8 (GLOVE) ×1
GLOVE ORTHO TXT STRL SZ7.5 (GLOVE) ×1 IMPLANT
GLOVE PI ORTHO PRO STRL 7.5 (GLOVE) ×1 IMPLANT
GLOVE PI ORTHO PRO STRL SZ7 (GLOVE) IMPLANT
GLOVE PI ORTHO PRO STRL SZ8 (GLOVE) ×1 IMPLANT
GLOVE SURG ORTHO 8.5 STRL (GLOVE) ×2 IMPLANT
GLOVE SURG SS PI 7.0 STRL IVOR (GLOVE) ×1 IMPLANT
GOWN STRL NON-REIN LRG LVL3 (GOWN DISPOSABLE) ×4 IMPLANT
GOWN STRL REIN XL XLG (GOWN DISPOSABLE) ×6 IMPLANT
IMMOBILIZER KNEE 22 UNIV (SOFTGOODS) ×1 IMPLANT
KIT BASIN OR (CUSTOM PROCEDURE TRAY) ×2 IMPLANT
KIT ROOM TURNOVER OR (KITS) ×2 IMPLANT
MANIFOLD NEPTUNE II (INSTRUMENTS) ×2 IMPLANT
PACK ARTHROSCOPY DSU (CUSTOM PROCEDURE TRAY) ×2 IMPLANT
PAD ARMBOARD 7.5X6 YLW CONV (MISCELLANEOUS) ×4 IMPLANT
PENCIL BUTTON HOLSTER BLD 10FT (ELECTRODE) IMPLANT
SET ARTHROSCOPY TUBING (MISCELLANEOUS) ×2
SET ARTHROSCOPY TUBING LN (MISCELLANEOUS) ×1 IMPLANT
SPONGE GAUZE 4X4 12PLY (GAUZE/BANDAGES/DRESSINGS) ×1 IMPLANT
STRIP CLOSURE SKIN 1/2X4 (GAUZE/BANDAGES/DRESSINGS) ×1 IMPLANT
SUT MNCRL AB 4-0 PS2 18 (SUTURE) ×2 IMPLANT
TOWEL OR 17X24 6PK STRL BLUE (TOWEL DISPOSABLE) ×4 IMPLANT
TUBE CONNECTING 12X1/4 (SUCTIONS) ×1 IMPLANT
WAND 90 DEG TURBOVAC W/CORD (SURGICAL WAND) ×1 IMPLANT
WATER STERILE IRR 1000ML POUR (IV SOLUTION) ×2 IMPLANT
WRAP KNEE MAXI GEL POST OP (GAUZE/BANDAGES/DRESSINGS) ×2 IMPLANT

## 2012-02-22 NOTE — Interval H&P Note (Signed)
History and Physical Interval Note:  02/22/2012 10:42 AM  Brooke Sullivan  has presented today for surgery, with the diagnosis of LEFT KNEE MEDIAL/LATERAL MENISCAL TEAR  The various methods of treatment have been discussed with the patient and family. After consideration of risks, benefits and other options for treatment, the patient has consented to  Procedure(s) (LRB) with comments: ARTHROSCOPY KNEE (Left) as a surgical intervention .  The patient's history has been reviewed, patient examined, no change in status, stable for surgery.  I have reviewed the patient's chart and labs.  Questions were answered to the patient's satisfaction.     Sapir Lavey,STEVEN R

## 2012-02-22 NOTE — Anesthesia Preprocedure Evaluation (Addendum)
Anesthesia Evaluation  Patient identified by MRN, date of birth, ID band Patient awake    Reviewed: Allergy & Precautions, H&P , NPO status , Patient's Chart, lab work & pertinent test results  History of Anesthesia Complications Negative for: history of anesthetic complications  Airway Mallampati: I TM Distance: >3 FB Neck ROM: Full    Dental  (+) Teeth Intact and Dental Advisory Given   Pulmonary neg pulmonary ROS,  breath sounds clear to auscultation  Pulmonary exam normal       Cardiovascular hypertension, Pt. on medications Rhythm:Regular Rate:Normal     Neuro/Psych  Headaches,    GI/Hepatic negative GI ROS, Neg liver ROS,   Endo/Other    Renal/GU negative Renal ROS     Musculoskeletal   Abdominal   Peds  Hematology   Anesthesia Other Findings Pt has a ring on her right ring finger.  She refuses to remove it AMA. She understands there is risk with metal and circumferential jewelry.  She accepts this risk.  Reproductive/Obstetrics                         Anesthesia Physical Anesthesia Plan  ASA: II  Anesthesia Plan: General   Post-op Pain Management:    Induction: Intravenous  Airway Management Planned: LMA  Additional Equipment:   Intra-op Plan:   Post-operative Plan: Extubation in OR  Informed Consent: I have reviewed the patients History and Physical, chart, labs and discussed the procedure including the risks, benefits and alternatives for the proposed anesthesia with the patient or authorized representative who has indicated his/her understanding and acceptance.   Dental advisory given  Plan Discussed with: CRNA, Anesthesiologist and Surgeon  Anesthesia Plan Comments:        Anesthesia Quick Evaluation

## 2012-02-22 NOTE — Transfer of Care (Signed)
Immediate Anesthesia Transfer of Care Note  Patient: Brooke Sullivan  Procedure(s) Performed: Procedure(s) (LRB) with comments: ARTHROSCOPY KNEE (Left) - left knee arthroscopy  Patient Location: PACU  Anesthesia Type: General  Level of Consciousness: awake, alert  and oriented  Airway & Oxygen Therapy: Patient Spontanous Breathing  Post-op Assessment: Report given to PACU RN, Post -op Vital signs reviewed and stable and Patient moving all extremities X 4  Post vital signs: Reviewed and stable  Complications: No apparent anesthesia complications

## 2012-02-22 NOTE — Progress Notes (Signed)
Report given to stephanie rn as caregiver 

## 2012-02-22 NOTE — Anesthesia Postprocedure Evaluation (Signed)
  Anesthesia Post-op Note  Patient: Brooke Sullivan Code  Procedure(s) Performed: Procedure(s) (LRB) with comments: ARTHROSCOPY KNEE (Left) - left knee arthroscopy  Patient Location: PACU  Anesthesia Type: General  Level of Consciousness: awake, alert  and oriented  Airway and Oxygen Therapy: Patient Spontanous Breathing and Patient connected to nasal cannula oxygen  Post-op Pain: mild  Post-op Assessment: Post-op Vital signs reviewed and Patient's Cardiovascular Status Stable  Post-op Vital Signs: stable  Complications: No apparent anesthesia complications

## 2012-02-22 NOTE — Brief Op Note (Signed)
02/22/2012  12:40 PM  PATIENT:  Brooke Sullivan  61 y.o. female  PRE-OPERATIVE DIAGNOSIS:  LEFT KNEE MEDIAL/LATERAL MENISCAL TEAR, chondromalacia patellofemoral joint  POST-OPERATIVE DIAGNOSIS:  LEFT KNEE MEDIAL/LATERAL MENISCAL TEAR, chondromalacia patellofemoral joint  PROCEDURE:  Procedure(s) (LRB) with comments: ARTHROSCOPY KNEE (Left) - left knee arthroscopy partial medial and lateral meniscectomies  SURGEON:  Surgeon(s) and Role:    * Verlee Rossetti, MD - Primary  PHYSICIAN ASSISTANT:   ASSISTANTS: none   ANESTHESIA:   general  EBL:  Total I/O In: 1000 [I.V.:1000] Out: -   BLOOD ADMINISTERED:none  DRAINS: none   LOCAL MEDICATIONS USED:  MARCAINE     SPECIMEN:  No Specimen  DISPOSITION OF SPECIMEN:  N/A  COUNTS:  YES  TOURNIQUET:  * No tourniquets in log *  DICTATION: .Other Dictation: Dictation Number [id number]  PLAN OF CARE: Discharge to home after PACU  PATIENT DISPOSITION:  PACU - hemodynamically stable.   Delay start of Pharmacological VTE agent (>24hrs) due to surgical blood loss or risk of bleeding: not applicable

## 2012-02-22 NOTE — Preoperative (Signed)
Beta Blockers   Reason not to administer Beta Blockers:Not Applicable 

## 2012-02-23 NOTE — Op Note (Signed)
Brooke Sullivan, Brooke Sullivan NO.:  [id number]  MEDICAL RECORD NO.:  [id number]  LOCATION:  [facility]                         FACILITY:  [facility]  PHYSICIAN:  Almedia Balls. Ranell Patrick, M.D. DATE OF BIRTH:  31-May-1950  DATE OF PROCEDURE:  02/22/2012 DATE OF DISCHARGE:  02/22/2012                              OPERATIVE REPORT   PREOPERATIVE DIAGNOSIS:  Displaced left medial and lateral meniscal tears with chondromalacia patellofemoral joint and multiple loose bodies, lateral tibial condyle chondromalacia grade 3.  POSTOPERATIVE DIAGNOSIS:  Displaced left medial and lateral meniscal tears with chondromalacia patellofemoral joint and multiple loose bodies, lateral tibial condyle chondromalacia grade 3.  PROCEDURE PERFORMED:  Left knee arthroscopy, partial medial and lateral meniscectomies, chondroplasty not to bleeding bone patellofemoral joint and patellofemoral compartment and evacuation of loose bodies.  ATTENDING SURGEON:  Almedia Balls. Ranell Patrick, M.D.  ASSISTANT:  None.  ANESTHESIA:  LMA general anesthesia was used.  ESTIMATED BLOOD LOSS:  Minimal.  FLUID REPLACEMENT:  1200 mL of crystalloids.  INSTRUMENT COUNTS:  Correct.  COMPLICATIONS:  There were no complications.  Perioperative antibiotics were given.  INDICATIONS:  The patient is a 61 year old female with worsening left medial knee pain and lateral knee pain.  The patient has failed all measures of conservative management and has an MRI scan and clinical workup indicating a torn medial and lateral menisci.  The patient has failed conservative management.  Desires operative treatment to restore function, limit pain to her knee.  Informed consent obtained.  DESCRIPTION OF PROCEDURE:  After an adequate level of anesthesia was achieved, the patient was positioned supine on the operating room table. Left leg was correctly identified.  Time-out called.  Sterile prep and drape of the left knee performed.  Lateral post utilized.   We entered the knee using standard portals including superolateral outflow, anterolateral scope, and anteromedial working portals.  We identified some loose bodies within the joint.  They were evacuated using suction shaver.  There was significant patellofemoral chondromalacia centrally menisci and patella with some on the trochlea.  There were couple areas with deep wear on the trochlea just to the lateral side of the central part of the trochlea with some visible bone underneath.  The remainder of the wear was grade 3.  We performed a gentle tangential chondroplasty, not to bleeding bone in the patellofemoral compartment, medial and lateral gutters inspected, loose bodies evacuated.  We entered the medial compartment.  It was complex based on her medial meniscus tear, not amenable to repair.  We performed a partial meniscectomy using basket forceps and motorized shaver back to a smooth meniscal rim.  ACL and PCL were intact.  Lateral meniscus torn.  This represented a pretty much entire meniscus.  There was about a 30% to 40% tear.  We performed partial meniscectomy again with basket forceps, motorized shaver, removing all unstable meniscal tissue.  The patient tolerated this well.  We checked and there were no areas of severe chondromalacia on the medial compartment and lateral compartment had grade 3 chondromalacia on the tibial condyle.  We performed a chondroplasty on the tibial side as well not to bleeding bone. Following completion of the chondroplasty in 2 compartments,  partial meniscectomies, removal of loose bodies, we concluded surgery suturing.  Once we had completed the clean out of the knee, we went ahead and sutured the wounds with 4-0 Monocryl followed by Steri-Strips and sterile compressive bandage.  We also applied knee immobilizer as I did feel a slight pop in the knee as we were doing valgus stress, which could have been partial MCL.  She was stable at 0 and at 30  after surgery, but I wanted to put her in a knee immobilizer in addition to cryotherapy and then she was transported to the recovery room.     Almedia Balls. Ranell Patrick, M.D.     SRN/MEDQ  D:  02/22/2012  T:  02/23/2012  Job:  [id number]

## 2012-02-25 ENCOUNTER — Encounter (HOSPITAL_COMMUNITY): Payer: Self-pay | Admitting: Orthopedic Surgery

## 2012-02-25 NOTE — Progress Notes (Signed)
Patient peeved with effects of med. Told everyone low tolerance for pain meds.

## 2012-02-28 ENCOUNTER — Other Ambulatory Visit: Payer: Self-pay | Admitting: Internal Medicine

## 2012-03-28 ENCOUNTER — Other Ambulatory Visit: Payer: Self-pay | Admitting: Internal Medicine

## 2012-03-29 ENCOUNTER — Other Ambulatory Visit: Payer: Self-pay | Admitting: *Deleted

## 2012-06-02 ENCOUNTER — Other Ambulatory Visit: Payer: Self-pay | Admitting: Physician Assistant

## 2012-06-04 ENCOUNTER — Other Ambulatory Visit: Payer: Self-pay | Admitting: *Deleted

## 2012-06-04 NOTE — Telephone Encounter (Signed)
Pharmacy requesting refill on ambien 5mg . Last fill on 03/29/12

## 2012-06-05 ENCOUNTER — Telehealth: Payer: Self-pay

## 2012-06-05 NOTE — Telephone Encounter (Signed)
Was routed to Dr Perrin Maltese. No response yet. Dr Perrin Maltese out of office today . I advised her message sent to Dr Perrin Maltese and we should hear something from him soon.

## 2012-06-05 NOTE — Telephone Encounter (Signed)
Forward to Dr. Perrin Maltese

## 2012-06-05 NOTE — Telephone Encounter (Signed)
Patient says the pharmacy has sent [place] refill request for Brooke Sullivan but has not heard anything back yet please call [phone]

## 2012-06-06 MED ORDER — ZOLPIDEM TARTRATE 5 MG PO TABS: 5.0000 mg | ORAL_TABLET | Freq: Every evening | ORAL | Status: DC | PRN

## 2012-06-06 NOTE — Telephone Encounter (Signed)
Ok 3 months

## 2012-06-06 NOTE — Telephone Encounter (Signed)
Called into pharmacy

## 2012-06-27 ENCOUNTER — Ambulatory Visit: Payer: BC Managed Care – PPO

## 2012-06-27 ENCOUNTER — Ambulatory Visit (INDEPENDENT_AMBULATORY_CARE_PROVIDER_SITE_OTHER): Payer: BC Managed Care – PPO | Admitting: Family Medicine

## 2012-06-27 VITALS — BP 157/78 | HR 87 | Temp 98.3°F | Resp 18 | Ht 69.5 in | Wt 185.0 lb

## 2012-06-27 DIAGNOSIS — R110 Nausea: Secondary | ICD-10-CM

## 2012-06-27 DIAGNOSIS — J0100 Acute maxillary sinusitis, unspecified: Secondary | ICD-10-CM

## 2012-06-27 DIAGNOSIS — R109 Unspecified abdominal pain: Secondary | ICD-10-CM

## 2012-06-27 DIAGNOSIS — R1013 Epigastric pain: Secondary | ICD-10-CM

## 2012-06-27 DIAGNOSIS — R519 Headache, unspecified: Secondary | ICD-10-CM

## 2012-06-27 DIAGNOSIS — K3189 Other diseases of stomach and duodenum: Secondary | ICD-10-CM

## 2012-06-27 DIAGNOSIS — R51 Headache: Secondary | ICD-10-CM

## 2012-06-27 LAB — POCT CBC
Granulocyte percent: 59.4 % (ref 37–80)
HCT, POC: 45.8 % (ref 37.7–47.9)
Hemoglobin: 14.9 g/dL (ref 12.2–16.2)
Lymph, poc: 2 (ref 0.6–3.4)
MCH, POC: 29.9 pg (ref 27–31.2)
MCHC: 32.5 g/dL (ref 31.8–35.4)
MCV: 91.8 fL (ref 80–97)
MID (cbc): 0.6 (ref 0–0.9)
MPV: 8.1 fL (ref 0–99.8)
POC Granulocyte: 3.7 (ref 2–6.9)
POC LYMPH PERCENT: 31.4 %L (ref 10–50)
POC MID %: 9.2 % (ref 0–12)
Platelet Count, POC: 332 10*3/uL (ref 142–424)
RBC: 4.99 M/uL (ref 4.04–5.48)
RDW, POC: 13.8 %
WBC: 6.3 10*3/uL (ref 4.6–10.2)

## 2012-06-27 MED ORDER — PROMETHAZINE HCL 25 MG PO TABS: 25.0000 mg | ORAL_TABLET | Freq: Three times a day (TID) | ORAL | Status: DC | PRN
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
## 2012-06-28 ENCOUNTER — Telehealth: Payer: Self-pay | Admitting: Family Medicine

## 2012-06-28 LAB — COMPREHENSIVE METABOLIC PANEL
Alkaline Phosphatase: 84 U/L (ref 39–117)
BUN: 12 mg/dL (ref 6–23)
CO2: 28 mEq/L (ref 19–32)
Creat: 0.74 mg/dL (ref 0.50–1.10)
Glucose, Bld: 87 mg/dL (ref 70–99)
Total Bilirubin: 0.4 mg/dL (ref 0.3–1.2)
Total Protein: 7.2 g/dL (ref 6.0–8.3)

## 2012-06-28 LAB — COMPREHENSIVE METABOLIC PANEL WITH GFR
ALT: 23 U/L (ref 0–35)
AST: 21 U/L (ref 0–37)
Albumin: 4.8 g/dL (ref 3.5–5.2)
Calcium: 9.8 mg/dL (ref 8.4–10.5)
Chloride: 102 meq/L (ref 96–112)
Potassium: 4.3 meq/L (ref 3.5–5.3)
Sodium: 138 meq/L (ref 135–145)

## 2012-06-28 LAB — AMYLASE: Amylase: 34 U/L (ref 0–105)

## 2012-06-28 LAB — LIPASE: Lipase: 22 U/L (ref 0–75)

## 2012-06-28 NOTE — Telephone Encounter (Signed)
D/w patietn sxs. Phenergan did not help with Ha, helped a little with nausea. She is going to try Tylenol with sinus, offered fioricet she does not want it. She will call me if no gettign better. Will try Prilosec for nausea. CMP,lipase, amylase still pending. Xrays were negative.

## 2012-06-30 ENCOUNTER — Encounter: Payer: Self-pay | Admitting: Family Medicine

## 2012-07-01 ENCOUNTER — Telehealth: Payer: Self-pay | Admitting: Family Medicine

## 2012-07-01 NOTE — Telephone Encounter (Signed)
Patient called to get the rest of her lab results. They are back but not reviewed. Please advise.

## 2012-07-01 NOTE — Telephone Encounter (Signed)
Spoke to patient about labs. All normal. She will try PRilosec x 2 weeks. Will call me on MOnday and see if any better, keep food diary. IF no better will get Abdominal  US for nausea. IF [place] bnegative then refer to GI.

## 2012-07-17 ENCOUNTER — Ambulatory Visit (INDEPENDENT_AMBULATORY_CARE_PROVIDER_SITE_OTHER): Payer: BC Managed Care – PPO | Admitting: Cardiovascular Disease

## 2012-07-17 ENCOUNTER — Encounter: Payer: Self-pay | Admitting: Cardiovascular Disease

## 2012-07-17 VITALS — BP 128/82 | HR 68 | Ht 69.5 in | Wt 184.5 lb

## 2012-07-17 DIAGNOSIS — R0789 Other chest pain: Secondary | ICD-10-CM

## 2012-07-17 DIAGNOSIS — I10 Essential (primary) hypertension: Secondary | ICD-10-CM

## 2012-07-17 DIAGNOSIS — R079 Chest pain, unspecified: Secondary | ICD-10-CM

## 2012-07-17 NOTE — Patient Instructions (Addendum)
Your physician has requested that you have a lexiscan myoview. For further information please visit [email or web address]. Please follow instruction sheet, as given.  Follow up as needed 

## 2012-07-17 NOTE — Assessment & Plan Note (Signed)
The patient's chest tightness has been mostly at rest and not exertional. Overall atypical for angina. She does have risk factors for coronary artery disease and thus I recommend further evaluation with a pharmacologic nuclear stress test. She is not able to exercise on a treadmill due to left knee arthritis and surgery last year. Some of her symptoms seem to be triggered by stress and anxiety.

## 2012-07-17 NOTE — Progress Notes (Signed)
Primary care physician: Dr. Perrin Maltese  HPI  This is a pleasant 62 year old Caucasian female who is here today for evaluation of chest pain. She is known to our practice and has been seen in the past by Dr. wall most recently in 2012 for atypical chest pain and palpitations. She underwent a treadmill nuclear stress test which showed no evidence of ischemia. She was switched from amlodipine to diltiazem for palpitations but did not tolerate the medication. She has known history of hypertension which is well-controlled on amlodipine. She has family history of coronary artery disease. She is a previous smoker with possible mild COPD. She's been having increased symptoms of substernal chest tightness radiating to her neck mostly at rest and at night. She also has known history of GERD but that has been effectively treated with omeprazole and promethazine. She has mild exertional dyspnea but no orthopnea or PND.  Allergies  Allergen Reactions  . Codeine Nausea Only    Makes patient sleep for a long time and nauseated  . Naproxen Hives and Swelling  . Sulfonamide Derivatives Other (See Comments)    Yeast infection.     Current Outpatient Prescriptions on File Prior to Visit  Medication Sig Dispense Refill  . amLODipine (NORVASC) 2.5 MG tablet Take 1 tablet (2.5 mg total) by mouth daily.  90 tablet  3  . Coral Calcium 500 MG TABS Take 500 mg by mouth 2 (two) times daily.      Marland Kitchen ibuprofen (ADVIL,MOTRIN) 200 MG tablet Take 400 mg by mouth every 6 (six) hours as needed.      Marland Kitchen omeprazole (PRILOSEC) 20 MG capsule Take 20 mg by mouth daily as needed. For indigestion.      Marland Kitchen OVER THE COUNTER MEDICATION Take 1 tablet by mouth daily. [employer] brand energy-booster.      . promethazine (PHENERGAN) 25 MG tablet Take 1 tablet (25 mg total) by mouth every 8 (eight) hours as needed for nausea.  20 tablet  0  . zolpidem (AMBIEN) 5 MG tablet Take 1 tablet (5 mg total) by mouth at bedtime as needed for sleep.  30 tablet  2    No current facility-administered medications on file prior to visit.     Past Medical History  Diagnosis Date  . Chest pain, atypical   . Arthritis     hands bilateral  . Hypertension   . Headache   . Shortness of breath     with exertion  . RLD (ruptured lumbar disc)      Past Surgical History  Procedure Laterality Date  . Spine surgery    . Knee arthroscopy  02/22/2012    Procedure: ARTHROSCOPY KNEE;  Surgeon: Verlee Rossetti, MD;  Location: [facility] OR;  Service: Orthopedics;  Laterality: Left;  left knee arthroscopy     Family History  Problem Relation Age of Onset  . Heart failure Father 36    died. also heavy alcohol use     History   Social History  . Marital Status: Single    Spouse Name: N/A    Number of Children: N/A  . Years of Education: N/A   Occupational History  . Not on file.   Social History Main Topics  . Smoking status: Former Smoker -- 1.00 packs/day for 20 years    Types: Cigarettes    Quit date: 09/19/1999  . Smokeless tobacco: Not on file  . Alcohol Use: 4.2 oz/week    7 Glasses of wine per week  Comment: socially  . Drug Use: No  . Sexually Active: Not on file   Other Topics Concern  . Not on file   Social History Narrative  . No narrative on file      PHYSICAL EXAM   BP 128/82  Pulse 68  Ht 5' 9.5" (1.765 m)  Wt 184 lb 8 oz (83.689 kg)  BMI 26.86 kg/m2 Constitutional: She is oriented to person, place, and time. She appears well-developed and well-nourished. No distress.  HENT: No nasal discharge.  Head: Normocephalic and atraumatic.  Eyes: Pupils are equal and round. Right eye exhibits no discharge. Left eye exhibits no discharge.  Neck: Normal range of motion. Neck supple. No JVD present. No thyromegaly present.  Cardiovascular: Normal rate, regular rhythm, normal heart sounds. Exam reveals no gallop and no friction rub. No murmur heard.  Pulmonary/Chest: Effort normal and breath sounds normal. No stridor. No  respiratory distress. She has no wheezes. She has no rales. She exhibits no tenderness.  Abdominal: Soft. Bowel sounds are normal. She exhibits no distension. There is no tenderness. There is no rebound and no guarding.  Musculoskeletal: Normal range of motion. She exhibits no edema and no tenderness.  Neurological: She is alert and oriented to person, place, and time. Coordination normal.  Skin: Skin is warm and dry. No rash noted. She is not diaphoretic. No erythema. No pallor.  Psychiatric: She has a normal mood and affect. Her behavior is normal. Judgment and thought content normal.    ZOX:WRUEAV sinus rhythm with no significant ST or T wave changes.    ASSESSMENT AND PLAN

## 2012-07-17 NOTE — Assessment & Plan Note (Signed)
Blood pressure is well controlled on amlodipine. 

## 2012-07-21 ENCOUNTER — Ambulatory Visit: Payer: Self-pay | Admitting: Cardiovascular Disease

## 2012-07-21 DIAGNOSIS — R079 Chest pain, unspecified: Secondary | ICD-10-CM

## 2012-07-23 ENCOUNTER — Telehealth: Payer: Self-pay

## 2012-07-23 ENCOUNTER — Other Ambulatory Visit: Payer: Self-pay

## 2012-07-23 MED ORDER — AMLODIPINE BESYLATE 5 MG PO TABS: 5.0000 mg | ORAL_TABLET | Freq: Every day | ORAL | Status: DC

## 2012-07-23 NOTE — Telephone Encounter (Signed)
Stress test was normal. The chest pain is no heart related.  BP was controlled during office visit. If her BP is running high, we can increase Amlodipine to 5 mg daily.  Her symptoms might be due to GERD and esophageal spasm. I suggest that she talks with PCP.

## 2012-07-23 NOTE — Telephone Encounter (Signed)
Pt walked in office requesting lexiscan results She had this done 07/21/12 I was able to give her preliminary results which were normal She is upset that we did not call her yesterday i explained it can take longer tah 24 hours to get results  She asks what Dr. Kirke Corin wants her to "do now" re:her BP and spells she is having (discussed at OV) She is taking amlodipine 2.5 mg qhs  I told her I would try to get an answer today She reminds me she is going out of town tomm and would like a response today

## 2012-07-23 NOTE — Telephone Encounter (Signed)
Pt informed New RX sent to pharm She will f/u with PCP

## 2012-07-31 ENCOUNTER — Other Ambulatory Visit: Payer: Self-pay

## 2012-07-31 DIAGNOSIS — R079 Chest pain, unspecified: Secondary | ICD-10-CM

## 2012-09-01 ENCOUNTER — Other Ambulatory Visit: Payer: Self-pay | Admitting: Internal Medicine

## 2012-09-10 ENCOUNTER — Other Ambulatory Visit: Payer: Self-pay | Admitting: Family Medicine

## 2012-09-10 ENCOUNTER — Other Ambulatory Visit: Payer: Self-pay | Admitting: Internal Medicine

## 2012-09-10 NOTE — Telephone Encounter (Signed)
Pt is calling because her pharmacy is suppose to be faxing over a request on her blood pressure medication and she is wanting to let someone know that she is completely out of her blood pressure medication. Call back number is [phone] Pharmacy is [facility] [address].

## 2012-09-11 NOTE — Telephone Encounter (Signed)
Dr Conley Rolls, do you want to RF for pt, or see pt back for re-eval?

## 2012-10-10 ENCOUNTER — Other Ambulatory Visit: Payer: Self-pay | Admitting: Internal Medicine

## 2012-10-11 ENCOUNTER — Telehealth: Payer: Self-pay

## 2012-10-11 DIAGNOSIS — I10 Essential (primary) hypertension: Secondary | ICD-10-CM

## 2012-10-11 NOTE — Telephone Encounter (Signed)
Does she need to RTC? Looks like no follow up recently.

## 2012-10-11 NOTE — Telephone Encounter (Signed)
This was sent in on 10/10/12. She will need on office visit for further refills.

## 2012-10-11 NOTE — Telephone Encounter (Signed)
Patient calling because she doesn't understand why she does not have refills on amLODipine (NORVASC) 5 MG tablet  anymore. She needs a refill because its for her blood pressure?   Ph[facility]acy at [facility]: [facility] [address], [place]  Call her if this can be done or not. [phone]

## 2012-10-13 NOTE — Telephone Encounter (Signed)
Patient advised she has seen you recently and does not want to come in for a follow up. She wants to know if you can send this in for her. Please advise if you can add refills on this.

## 2012-10-13 NOTE — Telephone Encounter (Signed)
Called her to advise, left message for her to call me back 

## 2012-10-14 MED ORDER — AMLODIPINE BESYLATE 5 MG PO TABS: 5.0000 mg | ORAL_TABLET | Freq: Every day | ORAL | Status: DC

## 2012-10-14 NOTE — Telephone Encounter (Signed)
Lm that refilled Norvasc 5 mg daily for 3 months. Need to return for OV for HTN before refills are up.

## 2012-12-13 ENCOUNTER — Ambulatory Visit (INDEPENDENT_AMBULATORY_CARE_PROVIDER_SITE_OTHER): Payer: BC Managed Care – PPO | Admitting: Emergency Medicine

## 2012-12-13 VITALS — BP 150/70 | HR 64 | Temp 97.6°F | Resp 20 | Ht 68.5 in | Wt 183.0 lb

## 2012-12-13 DIAGNOSIS — G4700 Insomnia, unspecified: Secondary | ICD-10-CM

## 2012-12-13 DIAGNOSIS — I10 Essential (primary) hypertension: Secondary | ICD-10-CM

## 2012-12-13 DIAGNOSIS — L509 Urticaria, unspecified: Secondary | ICD-10-CM

## 2012-12-13 LAB — COMPREHENSIVE METABOLIC PANEL
Albumin: 4.3 g/dL (ref 3.5–5.2)
BUN: 12 mg/dL (ref 6–23)
CO2: 25 mEq/L (ref 19–32)
Glucose, Bld: 94 mg/dL (ref 70–99)
Sodium: 138 mEq/L (ref 135–145)
Total Bilirubin: 0.3 mg/dL (ref 0.3–1.2)
Total Protein: 6.7 g/dL (ref 6.0–8.3)

## 2012-12-13 LAB — POCT CBC
Lymph, poc: 1.2 (ref 0.6–3.4)
MCH, POC: 30 pg (ref 27–31.2)
MCHC: 31.5 g/dL — AB (ref 31.8–35.4)
MCV: 95 fL (ref 80–97)
MID (cbc): 0.5 (ref 0–0.9)
POC LYMPH PERCENT: 23.1 %L (ref 10–50)
Platelet Count, POC: 304 10*3/uL (ref 142–424)
RBC: 4.7 M/uL (ref 4.04–5.48)
WBC: 5.3 10*3/uL (ref 4.6–10.2)

## 2012-12-13 MED ORDER — ZOLPIDEM TARTRATE 5 MG PO TABS: 5.0000 mg | ORAL_TABLET | Freq: Every evening | ORAL | Status: DC | PRN

## 2012-12-13 MED ORDER — AMLODIPINE BESYLATE 2.5 MG PO TABS: 2.5000 mg | ORAL_TABLET | Freq: Every day | ORAL | Status: DC

## 2012-12-13 NOTE — Progress Notes (Signed)
  Subjective:    Patient ID: Brooke Sullivan, female    DOB: May 30, 1950, 62 y.o.   MRN: [id number]  HPI 62 y.o. Female presents to clinic today of rash on right foot since Thursday night after being at the pool all day. Tried anti itch spray and ointment she has at home. Noticed rash spread to upper right thigh last night; sprayed with benadryl. Noticed rash spread to lower abdomen, neck, inner thighs and groin this morning while at church.  States she is prone to sun poisoning. No new medicines, no recent travels. Has dogs, but they don't have fleas.  Needs refill on amlodipine, and ambien. Would like to have bloodwork done since she is loosing her insurance in 30 days.   Review of Systems     Objective:   Physical Exam There are raised to 3 mm papular areas involving the right leg trauma so on the back and some on the left side of the neck. They're most extensive on the right leg. Her chest is clear to her cardiac exam reveals a regular rate without murmurs  Results for orders placed in visit on 12/13/12  POCT CBC      Result Value Range   WBC 5.3  4.6 - 10.2 K/uL   Lymph, poc 1.2  0.6 - 3.4   POC LYMPH PERCENT 23.1  10 - 50 %L   MID (cbc) 0.5  0 - 0.9   POC MID % 9.3  0 - 12 %M   POC Granulocyte 3.6  2 - 6.9   Granulocyte percent 67.6  37 - 80 %G   RBC 4.70  4.04 - 5.48 M/uL   Hemoglobin 14.1  12.2 - 16.2 g/dL   HCT, POC 40.9  81.1 - 47.9 %   MCV 95.0  80 - 97 fL   MCH, POC 30.0  27 - 31.2 pg   MCHC 31.5 (*) 31.8 - 35.4 g/dL   RDW, POC 91.4     Platelet Count, POC 304  142 - 424 K/uL   MPV 8.3  0 - 99.8 fL       Assessment & Plan:  Amlodipine will be refilled. Advised patient she should have a Holter monitor done to see if we can document what her tachycardia is from. At the present time she is resistant to this .

## 2012-12-13 NOTE — Patient Instructions (Addendum)
Take Zyrtec 10 mg one a day. Take one Benadryl every 4-6 hours during the day and 1-2 at bedtime. Take Zantac 150 twice a day.Hives Hives are itchy, red, swollen areas of the skin. They can vary in size and location on your body. Hives can come and go for hours or several days (acute hives) or for several weeks (chronic hives). Hives do not spread from person to person (noncontagious). They may get worse with scratching, exercise, and emotional stress. CAUSES   Allergic reaction to food, additives, or drugs.  Infections, including the common cold.  Illness, such as vasculitis, lupus, or thyroid disease.  Exposure to sunlight, heat, or cold.  Exercise.  Stress.  Contact with chemicals. SYMPTOMS   Red or white swollen patches on the skin. The patches may change size, shape, and location quickly and repeatedly.  Itching.  Swelling of the hands, feet, and face. This may occur if hives develop deeper in the skin. DIAGNOSIS  Your caregiver can usually tell what is wrong by performing a physical exam. Skin or blood tests may also be done to determine the cause of your hives. In some cases, the cause cannot be determined. TREATMENT  Mild cases usually get better with medicines such as antihistamines. Severe cases may require an emergency epinephrine injection. If the cause of your hives is known, treatment includes avoiding that trigger.  HOME CARE INSTRUCTIONS   Avoid causes that trigger your hives.  Take antihistamines as directed by your caregiver to reduce the severity of your hives. Non-sedating or low-sedating antihistamines are usually recommended. Do not drive while taking an antihistamine.  Take any other medicines prescribed for itching as directed by your caregiver.  Wear loose-fitting clothing.  Keep all follow-up appointments as directed by your caregiver. SEEK MEDICAL CARE IF:   You have persistent or severe itching that is not relieved with medicine.  You have  painful or swollen joints. SEEK IMMEDIATE MEDICAL CARE IF:   You have a fever.  Your tongue or lips are swollen.  You have trouble breathing or swallowing.  You feel tightness in the throat or chest.  You have abdominal pain. These problems may be the first sign of a life-threatening allergic reaction. Call your local emergency services (911 in [place] MAKE SURE YOU:   Understand these instructions.  Will watch your condition.  Will get help right away if you are not doing well or get worse. Document Released: 05/07/2005 Document Revised: 11/06/2011 Document Reviewed: 07/31/2011 [facility] Patient Information 2014 [place], [place].

## 2012-12-17 ENCOUNTER — Telehealth: Payer: Self-pay

## 2012-12-17 NOTE — Telephone Encounter (Signed)
Pt states hives are not better,maybe worse,please advise Pt was given rx for ambien 5 mg instead of ambien 10 mg also    [employer] [facility] [phone] [facility] [address]

## 2012-12-18 NOTE — Telephone Encounter (Signed)
Dr Cleta Alberts did you want to Rx anything else? Please advise

## 2012-12-18 NOTE — Telephone Encounter (Signed)
Spoke with pt, she states she has gotten them in 10 mg and she breaks them in half. She states Dr Perrin Maltese has done this in the past. She loses her insurance on August 15. She still wants to have 10 mg. I also advised her to RTC for evaluation.

## 2012-12-18 NOTE — Telephone Encounter (Signed)
She needs to RTC for recheck. The max dose of ambien for women is 5 mg.

## 2012-12-19 MED ORDER — ZOLPIDEM TARTRATE 10 MG PO TABS: 5.0000 mg | ORAL_TABLET | Freq: Every evening | ORAL | Status: DC | PRN

## 2012-12-19 NOTE — Telephone Encounter (Signed)
It is fine to do it that way we can call in  #15 tablets of the 10 mg Ambien and she can take a half tablet a day.

## 2012-12-19 NOTE — Telephone Encounter (Signed)
done

## 2012-12-23 ENCOUNTER — Ambulatory Visit (INDEPENDENT_AMBULATORY_CARE_PROVIDER_SITE_OTHER): Payer: BC Managed Care – PPO | Admitting: Internal Medicine

## 2012-12-23 ENCOUNTER — Other Ambulatory Visit: Payer: Self-pay | Admitting: Internal Medicine

## 2012-12-23 VITALS — BP 120/72 | HR 79 | Temp 98.4°F | Resp 18 | Ht 68.0 in | Wt 181.0 lb

## 2012-12-23 DIAGNOSIS — L299 Pruritus, unspecified: Secondary | ICD-10-CM

## 2012-12-23 DIAGNOSIS — G4700 Insomnia, unspecified: Secondary | ICD-10-CM

## 2012-12-23 DIAGNOSIS — R21 Rash and other nonspecific skin eruption: Secondary | ICD-10-CM

## 2012-12-23 DIAGNOSIS — F439 Reaction to severe stress, unspecified: Secondary | ICD-10-CM

## 2012-12-23 MED ORDER — ZOLPIDEM TARTRATE 10 MG PO TABS: 10.0000 mg | ORAL_TABLET | Freq: Every evening | ORAL | Status: DC | PRN

## 2012-12-23 MED ORDER — TRIAMCINOLONE ACETONIDE 0.1 % EX CREA: TOPICAL_CREAM | Freq: Two times a day (BID) | CUTANEOUS | Status: DC

## 2012-12-23 MED ORDER — DOXYCYCLINE HYCLATE 100 MG PO TABS: 100.0000 mg | ORAL_TABLET | Freq: Two times a day (BID) | ORAL | Status: DC

## 2012-12-23 NOTE — Patient Instructions (Addendum)
Dr Elease Etienne office can see you on August 14th at 240pm appt will be with Louretta Parma, the phone number to the office is [phone].   Folliculitis  Folliculitis is redness, soreness, and swelling (inflammation) of the hair follicles. This condition can occur anywhere on the body. People with weakened immune systems, diabetes, or obesity have a greater risk of getting folliculitis. CAUSES  Bacterial infection. This is the most common cause.  Fungal infection.  Viral infection.  Contact with certain chemicals, especially oils and tars. Long-term folliculitis can result from bacteria that live in the nostrils. The bacteria may trigger multiple outbreaks of folliculitis over time. SYMPTOMS Folliculitis most commonly occurs on the scalp, thighs, legs, back, buttocks, and areas where hair is shaved frequently. An early sign of folliculitis is a small, white or yellow, pus-filled, itchy lesion (pustule). These lesions appear on a red, inflamed follicle. They are usually less than 0.2 inches (5 mm) wide. When there is an infection of the follicle that goes deeper, it becomes a boil or furuncle. A group of closely packed boils creates a larger lesion (carbuncle). Carbuncles tend to occur in hairy, sweaty areas of the body. DIAGNOSIS  Your caregiver can usually tell what is wrong by doing a physical exam. A sample may be taken from one of the lesions and tested in a lab. This can help determine what is causing your folliculitis. TREATMENT  Treatment may include:  Applying warm compresses to the affected areas.  Taking antibiotic medicines orally or applying them to the skin.  Draining the lesions if they contain a large amount of pus or fluid.  Laser hair removal for cases of long-lasting folliculitis. This helps to prevent regrowth of the hair. HOME CARE INSTRUCTIONS  Apply warm compresses to the affected areas as directed by your caregiver.  If antibiotics are prescribed, take them as  directed. Finish them even if you start to feel better.  You may take over-the-counter medicines to relieve itching.  Do not shave irritated skin.  Follow up with your caregiver as directed. SEEK IMMEDIATE MEDICAL CARE IF:   You have increasing redness, swelling, or pain in the affected area.  You have a fever. MAKE SURE YOU:  Understand these instructions.  Will watch your condition.  Will get help right away if you are not doing well or get worse. Document Released: 07/16/2001 Document Revised: 11/06/2011 Document Reviewed: 08/07/2011 [facility] Patient Information 2014 [place], [place]. Scabies Scabies are small bugs (mites) that burrow under the skin and cause red bumps and severe itching. These bugs can only be seen with a microscope. Scabies are highly contagious. They can spread easily from person to person by direct contact. They are also spread through sharing clothing or linens that have the scabies mites living in them. It is not unusual for an entire family to become infected through shared towels, clothing, or bedding.  HOME CARE INSTRUCTIONS   Your caregiver may prescribe a cream or lotion to kill the mites. If cream is prescribed, massage the cream into the entire body from the neck to the bottom of both feet. Also massage the cream into the scalp and face if your child is less than 31 year old. Avoid the eyes and mouth. Do not wash your hands after application.  Leave the cream on for 8 to 12 hours. Your child should bathe or shower after the 8 to 12 hour application period. Sometimes it is helpful to apply the cream to your child right before bedtime.  One treatment is usually effective and will eliminate approximately 95% of infestations. For severe cases, your caregiver may decide to repeat the treatment in 1 week. Everyone in your household should be treated with one application of the cream.  New rashes or burrows should not appear within 24 to 48 hours after  successful treatment. However, the itching and rash may last for 2 to 4 weeks after successful treatment. Your caregiver may prescribe a medicine to help with the itching or to help the rash go away more quickly.  Scabies can live on clothing or linens for up to 3 days. All of your child's recently used clothing, towels, stuffed toys, and bed linens should be washed in hot water and then dried in a dryer for at least 20 minutes on high heat. Items that cannot be washed should be enclosed in a plastic bag for at least 3 days.  To help relieve itching, bathe your child in a cool bath or apply cool washcloths to the affected areas.  Your child may return to school after treatment with the prescribed cream. SEEK MEDICAL CARE IF:   The itching persists longer than 4 weeks after treatment.  The rash spreads or becomes infected. Signs of infection include red blisters or yellow-tan crust. Document Released: 05/07/2005 Document Revised: 07/30/2011 Document Reviewed: 09/15/2008 [facility] Patient Information 2014 [place], [place]. Stress Stress-related medical problems are becoming increasingly common. The body has a built-in physical response to stressful situations. Faced with pressure, challenge or danger, we need to react quickly. Our bodies release hormones such as cortisol and adrenaline to help do this. These hormones are part of the "fight or flight" response and affect the metabolic rate, heart rate and blood pressure, resulting in a heightened, stressed state that prepares the body for optimum performance in dealing with a stressful situation. It is likely that early man required these mechanisms to stay alive, but usually modern stresses do not call for this, and the same hormones released in today's world can damage health and reduce coping ability. CAUSES  Pressure to perform at work, at school or in sports.  Threats of physical violence.  Money worries.  Arguments.  Family  conflicts.  Divorce or separation from significant other.  Bereavement.  New job or unemployment.  Changes in location.  Alcohol or drug abuse. SOMETIMES, THERE IS NO PARTICULAR REASON FOR DEVELOPING STRESS. Almost all people are at risk of being stressed at some time in their lives. It is important to know that some stress is temporary and some is long term.  Temporary stress will go away when a situation is resolved. Most people can cope with short periods of stress, and it can often be relieved by relaxing, taking a walk, chatting through issues with friends, or having a good night's sleep.  Chronic (long-term, continuous) stress is much harder to deal with. It can be psychologically and emotionally damaging. It can be harmful both for an individual and for friends and family. SYMPTOMS Everyone reacts to stress differently. There are some common effects that help [place] recognize it. In times of extreme stress, people may:  Shake uncontrollably.  Breathe faster and deeper than normal (hyperventilate).  Vomit.  For people with asthma, stress can trigger an attack.  For some people, stress may trigger migraine headaches, ulcers, and body pain. PHYSICAL EFFECTS OF STRESS MAY INCLUDE:  Loss of energy.  Skin problems.  Aches and pains resulting from tense muscles, including neck ache, backache and tension headaches.  Increased  pain from arthritis and other conditions.  Irregular heart beat (palpitations).  Periods of irritability or anger.  Apathy or depression.  Anxiety (feeling uptight or worrying).  Unusual behavior.  Loss of appetite.  Comfort eating.  Lack of concentration.  Loss of, or decreased, sex-drive.  Increased smoking, drinking, or recreational drug use.  For women, missed periods.  Ulcers, joint pain, and muscle pain. Post-traumatic stress is the stress caused by any serious accident, strong emotional damage, or extremely difficult or violent  experience such as rape or war. Post-traumatic stress victims can experience mixtures of emotions such as fear, shame, depression, guilt or anger. It may include recurrent memories or images that may be haunting. These feelings can last for weeks, months or even years after the traumatic event that triggered them. Specialized treatment, possibly with medicines and psychological therapies, is available. If stress is causing physical symptoms, severe distress or making it difficult for you to function as normal, it is worth seeing your caregiver. It is important to remember that although stress is a usual part of life, extreme or prolonged stress can lead to other illnesses that will need treatment. It is better to visit a doctor sooner rather than later. Stress has been linked to the development of high blood pressure and heart disease, as well as insomnia and depression. There is no diagnostic test for stress since everyone reacts to it differently. But a caregiver will be able to spot the physical symptoms, such as:  Headaches.  Shingles.  Ulcers. Emotional distress such as intense worry, low mood or irritability should be detected when the doctor asks pertinent questions to identify any underlying problems that might be the cause. In case there are physical reasons for the symptoms, the doctor may also want to do some tests to exclude certain conditions. If you feel that you are suffering from stress, try to identify the aspects of your life that are causing it. Sometimes you may not be able to change or avoid them, but even a small change can have a positive ripple effect. A simple lifestyle change can make all the difference. STRATEGIES THAT CAN HELP DEAL WITH STRESS:  Delegating or sharing responsibilities.  Avoiding confrontations.  Learning to be more assertive.  Regular exercise.  Avoid using alcohol or street drugs to cope.  Eating a healthy, balanced diet, rich in fruit and  vegetables and proteins.  Finding humor or absurdity in stressful situations.  Never taking on more than you know you can handle comfortably.  Organizing your time better to get as much done as possible.  Talking to friends or family and sharing your thoughts and fears.  Listening to music or relaxation tapes.  Tensing and then relaxing your muscles, starting at the toes and working up to the head and neck. If you think that you would benefit from help, either in identifying the things that are causing your stress or in learning techniques to help you relax, see a caregiver who is capable of helping you with this. Rather than relying on medications, it is usually better to try and identify the things in your life that are causing stress and try to deal with them. There are many techniques of managing stress including counseling, psychotherapy, aromatherapy, yoga, and exercise. Your caregiver can help you determine what is best for you. Document Released: 07/28/2002 Document Revised: 07/30/2011 Document Reviewed: 06/24/2007 [facility] Patient Information 2014 [place], [place].

## 2012-12-23 NOTE — Progress Notes (Signed)
  Subjective:    Patient ID: Brooke Sullivan, female    DOB: May 16, 1951, 62 y.o.   MRN: [id number]  HPI Rash is spreading, itching is worse, zyrtec made her sleepy. Itchy red bumps, some pustular, now between fingers, at creases and sweaty areas.  Under lots of stress, husband lost job and both losing health insurance soon. Not sleeping, needs to stock up on meds. Uses 5mg  zolpidem hs when cannot sleep, which is often lately   Review of Systems See record,      Objective:   Physical Exam  Constitutional: She is oriented to person, place, and time. She appears well-developed and well-nourished. She appears distressed.  Cardiovascular: Normal rate.   Pulmonary/Chest: Effort normal.  Musculoskeletal: Normal range of motion.  Neurological: She is alert and oriented to person, place, and time. She exhibits normal muscle tone. Coordination normal.  Skin: Rash noted. Rash is papular and pustular. There is erythema.  Itchy red bumps, excoriated all stages , new appearing daily, progressing, some pustular, worse in creases.  Psychiatric: Her speech is normal. Judgment and thought content normal. Her affect is blunt. She is agitated. Cognition and memory are normal.     Possible folliculitis or scabies/distant chance molluscum     Assessment & Plan:  Refer to Dermatology appt 9d with Dr. Terri Piedra Claritin/Benedryl/Zyrtec prn Triamcinolone cream/Doxycycline

## 2012-12-23 NOTE — Progress Notes (Signed)
VCO. Local anesthesia with 2% lidocaine with epinephrine. 3 mm punch biopsy obtained and sent for pathology.  #1 SI suture placed. Cleaned and band-aid applied.  Return in 7 days for suture removal.

## 2012-12-26 ENCOUNTER — Encounter: Payer: Self-pay | Admitting: *Deleted

## 2012-12-26 NOTE — Progress Notes (Signed)
Pt aware of results and mailed her a copy. She states she has an appt with the dermatologist in 2 weeks.

## 2013-05-07 ENCOUNTER — Ambulatory Visit (INDEPENDENT_AMBULATORY_CARE_PROVIDER_SITE_OTHER): Payer: BC Managed Care – PPO | Admitting: Physician Assistant

## 2013-05-07 VITALS — BP 116/68 | HR 83 | Temp 98.9°F | Resp 16 | Ht 68.5 in | Wt 186.0 lb

## 2013-05-07 DIAGNOSIS — J019 Acute sinusitis, unspecified: Secondary | ICD-10-CM

## 2013-05-07 MED ORDER — AMOXICILLIN-POT CLAVULANATE 875-125 MG PO TABS: 1.0000 | ORAL_TABLET | Freq: Two times a day (BID) | ORAL | Status: DC

## 2013-05-07 MED ORDER — GUAIFENESIN ER 1200 MG PO TB12: 1.0000 | ORAL_TABLET | Freq: Two times a day (BID) | ORAL | Status: DC | PRN

## 2013-05-07 MED ORDER — IPRATROPIUM BROMIDE 0.03 % NA SOLN: 2.0000 | Freq: Two times a day (BID) | NASAL | Status: DC

## 2013-05-07 MED ORDER — BENZONATATE 100 MG PO CAPS: 100.0000 mg | ORAL_CAPSULE | Freq: Three times a day (TID) | ORAL | Status: DC | PRN

## 2013-05-07 NOTE — Patient Instructions (Signed)
Get plenty of rest and drink at least 64 ounces of water daily. 

## 2013-05-07 NOTE — Progress Notes (Signed)
   Subjective:    Patient ID: Brooke Sullivan, female    DOB: [birth year], 62 y.o.   MRN: [id number]  PCP: Tally Due, MD  Chief Complaint  Patient presents with  . Nasal Congestion    x4 days   . Cough    x4 days   . Sore Throat    x4 days     HPI  Has taken 4 doses of doxycycline, left over ("I always keep a few of the pills from each prescription to take the next time I get sick."). Started with a scratchy throat 05/04/2013. Then developed cough.  "It's all up here," and points to the sinuses. Uses tessalon perles (BRAND) 1 Q 3 hours as needed (we discussed this, and she realizes that she actually takes it 3 times daily as needed, not Q3 hours)  "All I know is that I feel like crap." Fever 102 yesterday. No fever today. No myalgias. Has not had a flu vaccine this season.  Review of Systems No GI/GU symptoms. No SOB.    Objective:   Physical Exam  Blood pressure 116/68, pulse 83, temperature 98.9 F (37.2 C), temperature source Oral, resp. rate 16, height 5' 8.5" (1.74 m), weight 186 lb (84.369 kg), SpO2 90.00%. Body mass index is 27.87 kg/(m^2). Well-developed, well nourished WF who is awake, alert and oriented, in NAD. HEENT: [place]/AT, PERRL, EOMI.  Sclera and conjunctiva are clear.  EAC are patent, TMs are normal in appearance. Nasal mucosa is congested, pink and moist, with white mucous between the turbinates. OP is clear. Frontal and maxillary sinuses are tender bilaterally. Neck: supple, non-tender, no lymphadenopathy, thyromegaly. Heart: RRR, no murmur Lungs: normal effort, CTA Extremities: no cyanosis, clubbing or edema. Skin: warm and dry without rash. Psychologic: good mood and appropriate affect, normal speech and behavior.       Assessment & Plan:  1. Sinusitis, acute Supportive care.  Anticipatory guidance.  RTC if symptoms worsen/persist. - benzonatate (TESSALON PERLES) 100 MG capsule; Take 1-2 capsules (100-200 mg total) by mouth 3 (three) times  daily as needed for cough.  Dispense: 60 capsule; Refill: 0 - amoxicillin-clavulanate (AUGMENTIN) 875-125 MG per tablet; Take 1 tablet by mouth 2 (two) times daily.  Dispense: 20 tablet; Refill: 0 - ipratropium (ATROVENT) 0.03 % nasal spray; Place 2 sprays into both nostrils 2 (two) times daily.  Dispense: 30 mL; Refill: 0 - Guaifenesin (MUCINEX MAXIMUM STRENGTH) 1200 MG TB12; Take 1 tablet (1,200 mg total) by mouth every 12 (twelve) hours as needed.  Dispense: 14 tablet; Refill: 1   Fernande Bras, PA-C Physician Assistant-Certified [facility]

## 2013-05-09 ENCOUNTER — Telehealth: Payer: Self-pay

## 2013-05-09 NOTE — Telephone Encounter (Signed)
Patient needs call back in regards to meds given on last visit.  [phone]

## 2013-05-09 NOTE — Telephone Encounter (Signed)
PT CALLED BACK @ 6:00 AND WANTS [place] TO KNOW THAT THE ANTIBOTIC SHE WAS GIVEN IS CAUSING HER TO HAVE SEVERE DIAHERREA. CAN SHE TAKE SOMETHING ELSE?

## 2013-05-10 NOTE — Telephone Encounter (Signed)
Spoke with pt, she is having side effects to Amoxicillin. She is taking all her medications as prescribed. She would like to know if she can have something else called in.

## 2013-05-11 ENCOUNTER — Ambulatory Visit: Payer: BC Managed Care – PPO

## 2013-05-11 ENCOUNTER — Ambulatory Visit (INDEPENDENT_AMBULATORY_CARE_PROVIDER_SITE_OTHER): Payer: BC Managed Care – PPO | Admitting: Family Medicine

## 2013-05-11 ENCOUNTER — Telehealth: Payer: Self-pay | Admitting: Radiology

## 2013-05-11 VITALS — BP 132/74 | HR 82 | Temp 98.7°F | Resp 18 | Ht 67.5 in | Wt 185.0 lb

## 2013-05-11 DIAGNOSIS — R05 Cough: Secondary | ICD-10-CM

## 2013-05-11 DIAGNOSIS — J019 Acute sinusitis, unspecified: Secondary | ICD-10-CM

## 2013-05-11 DIAGNOSIS — J189 Pneumonia, unspecified organism: Secondary | ICD-10-CM

## 2013-05-11 DIAGNOSIS — R059 Cough, unspecified: Secondary | ICD-10-CM

## 2013-05-11 LAB — POCT CBC
Granulocyte percent: 44 %G (ref 37–80)
HCT, POC: 38.9 % (ref 37.7–47.9)
Hemoglobin: 11.9 g/dL — AB (ref 12.2–16.2)
MCV: 94.6 fL (ref 80–97)
MID (cbc): 0.4 (ref 0–0.9)
POC Granulocyte: 1.5 — AB (ref 2–6.9)
POC LYMPH PERCENT: 44.7 %L (ref 10–50)
RBC: 4.11 M/uL (ref 4.04–5.48)
RDW, POC: 12.8 %

## 2013-05-11 MED ORDER — AZITHROMYCIN 250 MG PO TABS: ORAL_TABLET | ORAL | Status: DC

## 2013-05-11 MED ORDER — BENZONATATE 100 MG PO CAPS: 100.0000 mg | ORAL_CAPSULE | Freq: Three times a day (TID) | ORAL | Status: DC | PRN

## 2013-05-11 NOTE — Progress Notes (Addendum)
Subjective:  This chart was scribed for Norberto Sorenson MD by Arlan Organ, ED Scribe. This patient was seen in room Room 9 and the patient's care was started 7:15 PM.    Patient ID: Brooke Sullivan, female    DOB: 03/31/1951, 62 y.o.   MRN: [id number]  Chief Complaint  Patient presents with  . Follow-up    sinusitis, patient getting worse  . Diarrhea  . Fever  . Nausea     HPI  HPI Comments: Brooke Sullivan is a 62 y.o. female with a h/o atypical CP, arthritis, and HTN who presents to [facility] seeking follow up from her visit on 12/18. She was seen 4 days ago c/o fever, sinus pressure, and cough. At the time, she was taking doxycycline (which she had a few of at home from a prior illness) and tessalon pearls as treatment with no relief. She was prescribed Augmentin and diagnosed w/ sinusitis, but states within 2 doses it caused her severe diarrhea and nausea. However, her sinus HA and pressure was also immed relieved.  She initially attributed her diarrhea and nausea symptoms due her concurrent illness. However as the diarrhea and nausea continued and worsened she began to be concerned that it was a side effect/intolerance to the Augmentin so discontinued her Augmentin after 2d. Over the weekend she was absolutely miserable with fever, chills, ShoB, cough, congestion, nausea, vomiting, and diarrhea.  She states her symptoms kept her from sleep for 3 nights straight. She has all of her kids and grandkids in town over the weekend to celebrate xmas and she was unable to participate at all or cook at all which is highly upsetting for her and them - everything was ruined and miserable. She states she attempted to call the clinic multiple times this weekend re her Augmentin reaction and says she did not receive a call back so she spent the weekend miserable w/o aide.  Her kids wanted her to go the ER but she just kept waiting to hear from [place]. She says her diarrhea has currently slowed down, but has not stopped.   She describes her diarrhea as watery brown and green w/o foul odor. She states she has been taking about 6 imodium a day. She denies trying any probiotics. She denies currently taking any other OTC medications besides ibuprofen and tylenol which she has alternated w/o relief. She says she is typically prone to bronchitis and pneumonia and has no sig h/o sinus cong/infection.   Pt states her husband has an alb neb machine at home, and plans to do a breathing treatment later this evening.    Past Medical History  Diagnosis Date  . Chest pain, atypical   . Arthritis     hands bilateral  . Hypertension   . Headache(784.0)   . Shortness of breath     with exertion  . RLD (ruptured lumbar disc)     Current Outpatient Prescriptions on File Prior to Visit  Medication Sig Dispense Refill  . benzonatate (TESSALON) 100 MG capsule Take 100 mg by mouth 3 (three) times daily as needed for cough.      . Coral Calcium 500 MG TABS Take 500 mg by mouth 2 (two) times daily.      Marland Kitchen doxycycline (VIBRA-TABS) 100 MG tablet Take 1 tablet (100 mg total) by mouth 2 (two) times daily.  20 tablet  0  . ipratropium (ATROVENT) 0.03 % nasal spray Place 2 sprays into both nostrils 2 (two) times daily.  30 mL  0  . omeprazole (PRILOSEC) 20 MG capsule Take 20 mg by mouth daily as needed. For indigestion.      Marland Kitchen OVER THE COUNTER MEDICATION Take 1 tablet by mouth daily. [employer] brand energy-booster.      Marland Kitchen amoxicillin-clavulanate (AUGMENTIN) 875-125 MG per tablet Take 1 tablet by mouth 2 (two) times daily.  20 tablet  0  . benzonatate (TESSALON PERLES) 100 MG capsule Take 1-2 capsules (100-200 mg total) by mouth 3 (three) times daily as needed for cough.  60 capsule  0  . Guaifenesin (MUCINEX MAXIMUM STRENGTH) 1200 MG TB12 Take 1 tablet (1,200 mg total) by mouth every 12 (twelve) hours as needed.  14 tablet  1  . ibuprofen (ADVIL,MOTRIN) 200 MG tablet Take 400 mg by mouth every 6 (six) hours as needed.      . triamcinolone  cream (KENALOG) 0.1 % Apply topically 2 (two) times daily. Do not use on face or genitals, apply bid till itchy rash gone prn  454 g  1   No current facility-administered medications on file prior to visit.    Allergies  Allergen Reactions  . Codeine Nausea Only    Makes patient sleep for a long time and nauseated  . Naproxen Hives and Swelling  . Sulfonamide Derivatives Other (See Comments)    Yeast infection.    Review of Systems  Constitutional: Positive for fever, chills, diaphoresis, activity change, appetite change and fatigue. Negative for unexpected weight change.  HENT: Negative for congestion, ear discharge, mouth sores, nosebleeds, postnasal drip, rhinorrhea, sinus pressure, sneezing, trouble swallowing and voice change.   Eyes: Negative for pain.  Respiratory: Positive for cough. Negative for shortness of breath.   Cardiovascular: Negative for chest pain.  Gastrointestinal: Positive for nausea, vomiting, diarrhea and constipation. Negative for abdominal pain.  Musculoskeletal: Positive for myalgias. Negative for gait problem and joint swelling.  Skin: Negative for rash.  Neurological: Negative for dizziness, syncope and headaches.  Hematological: Negative for adenopathy.  Psychiatric/Behavioral: Positive for sleep disturbance.      BP 132/74  Pulse 82  Temp(Src) 98.7 F (37.1 C) (Oral)  Resp 18  Ht 5' 7.5" (1.715 m)  Wt 185 lb (83.915 kg)  BMI 28.53 kg/m2  SpO2 92% - was 96% on recheck - pt declined alb neb. Objective:   Physical Exam  Nursing note and vitals reviewed. Constitutional: She is oriented to person, place, and time. She appears well-developed and well-nourished.  HENT:  Head: Normocephalic and atraumatic.  Right Ear: Tympanic membrane is injected and retracted.  Left Ear: Tympanic membrane is injected.  Nose: Mucosal edema present.  Mouth/Throat: Uvula is midline and oropharynx is clear and moist.  Eyes: Conjunctivae and EOM are normal. Pupils are  equal, round, and reactive to light.  Neck: Normal range of motion. Neck supple.  Cardiovascular: Normal rate, regular rhythm, S1 normal, S2 normal and normal heart sounds.   Pulmonary/Chest: Effort normal. No accessory muscle usage. Not tachypneic. No respiratory distress. She has decreased breath sounds. She has wheezes. She has rhonchi. She has no rales.  Bibasilar anterior and posterior wheeze and rhonchi which clear w/ repeated deep breathing.  Abdominal: She exhibits no distension.  Musculoskeletal: Normal range of motion.  Lymphadenopathy:       Head (right side): No submandibular and no tonsillar adenopathy present.       Head (left side): No submandibular and no tonsillar adenopathy present.    She has cervical adenopathy.  Right cervical: Superficial cervical adenopathy present. No deep cervical and no posterior cervical adenopathy present.      Left cervical: Superficial cervical adenopathy present. No deep cervical and no posterior cervical adenopathy present.       Right: No supraclavicular adenopathy present.       Left: No supraclavicular adenopathy present.  Neurological: She is alert and oriented to person, place, and time.  Skin: Skin is warm and dry.  Psychiatric: She has a normal mood and affect.   Results for orders placed in visit on 05/11/13  POCT CBC      Result Value Range   WBC 3.5 (*) 4.6 - 10.2 K/uL   Lymph, poc 1.6  0.6 - 3.4   POC LYMPH PERCENT 44.7  10 - 50 %L   MID (cbc) 0.4  0 - 0.9   POC MID % 11.3  0 - 12 %M   POC Granulocyte 1.5 (*) 2 - 6.9   Granulocyte percent 44.0  37 - 80 %G   RBC 4.11  4.04 - 5.48 M/uL   Hemoglobin 11.9 (*) 12.2 - 16.2 g/dL   HCT, POC 45.4  09.8 - 47.9 %   MCV 94.6  80 - 97 fL   MCH, POC 29.0  27 - 31.2 pg   MCHC 30.6 (*) 31.8 - 35.4 g/dL   RDW, POC 11.9     Platelet Count, POC 220  142 - 424 K/uL   MPV 8.2  0 - 99.8 fL    Primary X-Ray reading by Dr. Clelia Croft Chest X-Ray perhaps some bibasilar hilar subsegmental  atelectasis, no discrete infiltrate   EXAM: CHEST 2 VIEW  COMPARISON: 02/15/2012  FINDINGS: Cardiac shadow is within normal limits. The lungs are well aerated bilaterally without focal infiltrate or sizable effusion. Mild degenerative changes of the thoracic spine are noted.  IMPRESSION: No active cardiopulmonary disease.       Assessment & Plan:  CAP (community acquired pneumonia) - Plan: POCT CBC, DG Chest 2 View  Cough - suspect might be viral in origin due to leukopenia - however difficult to assess as may have been partially treated with doxycycline and augmentin. Offered to cont pt on full course of doxycycline but she declined. Wants to do zpack which I think is an excellent choice considering cxr results and variety of sxs.  Allergic to all cough syrups per pt so ok to cont prn tessalon - ok to call in for refill prn.  Poss that pt has had the flu but was out of 48-hr treatment window when she first sought care so no testing necessary.  Peak flow markedly reduced so offered alb neb to see if pt responds and needed a course of steroids for reactive airway but pt refused alb neb. Husband has neb machine at home that she will consider using if wheezing or ShoB.  Diarrhea - increase probiotics, ok to cont prn imodium. If persists - may need testing for c. diff.  Meds ordered this encounter  Medications  . DISCONTD: azithromycin (ZITHROMAX) 250 MG tablet    Sig: Take 2 tabs PO x 1 dose, then 1 tab PO QD x 4 days    Dispense:  6 tablet    Refill:  0  . DISCONTD: azithromycin (ZITHROMAX) 250 MG tablet    Sig: Take 2 tabs PO x 1 dose, then 1 tab PO QD x 4 days    Dispense:  6 tablet    Refill:  0  . benzonatate (TESSALON  PERLES) 100 MG capsule    Sig: Take 1-2 capsules (100-200 mg total) by mouth 3 (three) times daily as needed for cough.    Dispense:  60 capsule    Refill:  0    Order Specific Question:  Supervising Provider    Answer:  DOOLITTLE, ROBERT P [[id number]]  .  azithromycin (ZITHROMAX) 250 MG tablet    Sig: Take 2 tabs PO x 1 dose, then 1 tab PO QD x 4 days    Dispense:  6 tablet    Refill:  0    I personally performed the services described in this documentation, which was scribed in my presence. The recorded information has been reviewed and considered, and addended by me as needed.  Norberto Sorenson, MD MPH

## 2013-05-11 NOTE — Telephone Encounter (Signed)
I have called her and advised of Chelle message. Patient angry. She did not listen to me at all. I advised her I will have her call you back. She indicates she will call Dr Perrin Maltese, and will also the manager tomorrow. She is angry she did not get a call back this weekend. I want you to be aware of situation. She states she needs to be fast tracked today when she comes in today. I advised her I can not do this.

## 2013-05-11 NOTE — Telephone Encounter (Signed)
Please advise her to STOP the antibiotic (augmentin).  I advise against an antibiotic injection, and recommend switching to cefdinir 300 mg, 1 tablet Q12 hours x 10 days, #20, no refills.  The nausea should resolve, and THEN she can start a prednisone oral taper, which works just as fast as an injection (Prednisone 20 mg, 3 PO QAM x 3, then 2 PO QAM x 3, then 1 PO QAM x 3, #18, No RF).  Does she need more phenergan?  If so, 25 mg mg, 1/2-1 Q4-6 hours as needed.  She is still welcome to come in this evening, but hopefully won't need to with these changes.  The clinical team manager has also been advised of the situation and I expect her to address it.

## 2013-05-11 NOTE — Telephone Encounter (Signed)
Had lengthy conversation w/pt who was very upset that she has not heard back from a provider over the weekend after being so sick and needing another Abx sent in. Stated she called several times. Diarrhea/nausea started Fri. She was so sick Sat she did not take any Abx but worried about not taking one. When we didn't respond, she called [facility] who gave her some phenergan and advised her to cut Abx in half. She took one 1/2 tab on Sun and then no more. Still reports having diarrhea even with reg doses of immodium. Drinking plenty of fluids, but can't eat much. I apologized and agreed that her call should have been prioritized and someone should have gone to a provider to alert them to situation even if we were busy. Advised her I would go now and speak to a provider directly. Pt wants to talk to clinical manager and states she is so sick now, she is going to come in this evening when husband gets home and "better not have to sit and wait in waiting room, or she will show herself and pitch a fit". Pt wants a shot of Abx and/or steroid. Pt stated that she will talk to manager tonight or call back tomorrow. Stated she appreciated me listening to her and trying to help. Advised pt that French Ana will not be here after 5 pm but would be happy to speak with her tomorrow and I will advise her of situation.

## 2013-05-11 NOTE — Patient Instructions (Signed)
If your diarrhea is from the augmentin we should continue to see improvement. However, continued antibiotic therapy will make this difficult so start daily probiotic supplement to replenish the healthy bacteria in your gut that can help prevent diarrhea.  Occasionally, antibiotic use can lead to a specific gut infection called clostridium difficile so if your diarrhea persists, this will be something you may need to be tested for.

## 2013-05-11 NOTE — Telephone Encounter (Signed)
Called patient. Advised to d/c antibiotic. She will return to clinic this evening.

## 2013-05-12 NOTE — Telephone Encounter (Signed)
I spoke with pt about her issue with her encounter on the weekend in regards to the phone message. Pt was very upset that she did not get a phone call back about her side effect of sever diarrhea. During our conversation she stated she started the Amox on Friday, developed severe diarrhea and stopped the medication. On Saturday she started taking Doxycycline, as Dr. Perrin Maltese has prescribed that for her to keep on hand. She then spoke with a friend who suggested she try taking the Amox in a half dosage. So she started back the Amox by sig instruction from friend of 1/2pill. She said she called 4 times in the weekend and was told by staff that we would call her as soon as we could. She did not appreciate the fact that she did not get a person to talk with until Monday morning. She did have the opportunity to speak with Britta Mccreedy on Monday and Britta Mccreedy did try to help the pt out by offering a different type of medication to be sent in for her. The patient refused to get a medication sent in as she was adamant about coming in to be seen.   So pt returned back to the clinic on Monday night to be seen and was seen by Dr. Stephanie Acre received a new medication to take and I asked her how she was feeling today. She stated the cough has loosened up but she is not feeling better since yesterday. I apologized to her several times about the incident that occurred. I did explain to her on the weekends we operate with a smaller staff and it is difficult to address patient calls with patient's in the office waiting to be seen. She did not care about that, as that was not her problem.   I advised her that I would speak with the Clinical TL about the situation and follow up on this concern. She also wanted me to pass this information along to Dr. Perrin Maltese, as he is her PCP. I told her I would do so.

## 2013-05-12 NOTE — Telephone Encounter (Signed)
In continuation to prior message:  After speaking with Delaney Meigs about the incident, she stated that it was during an extremely busy time and she informed the patient to please RTC to be re-evaluated. Also, Delaney Meigs encouraged her to come in because she did not want the patient to be dehydrated and in pain. Delaney Meigs then sent the message to the PA pool to be addressed. Delaney Meigs states she was only approached by clerical staff 1 time.

## 2013-05-18 ENCOUNTER — Telehealth: Payer: Self-pay

## 2013-05-18 DIAGNOSIS — L299 Pruritus, unspecified: Secondary | ICD-10-CM

## 2013-05-18 DIAGNOSIS — R21 Rash and other nonspecific skin eruption: Secondary | ICD-10-CM

## 2013-05-18 DIAGNOSIS — F439 Reaction to severe stress, unspecified: Secondary | ICD-10-CM

## 2013-05-18 DIAGNOSIS — G4700 Insomnia, unspecified: Secondary | ICD-10-CM

## 2013-05-18 MED ORDER — DOXYCYCLINE HYCLATE 100 MG PO TABS: 100.0000 mg | ORAL_TABLET | Freq: Two times a day (BID) | ORAL | Status: DC

## 2013-05-18 NOTE — Telephone Encounter (Signed)
Called her to advise. Spoke to her. She states she had doxycycline on hand and took for 2 days. She wants to come in to see Dr Perrin Maltese only, she is provided his hours.

## 2013-05-18 NOTE — Telephone Encounter (Signed)
Z pack will continue to work, even though she has completed it, called her,  if she is worse, she should return to clinic. She states she is not improving at all. She states she will not come in again. Patient states she used Mucinex, it did not help much. Patient states no energy either. She states I should talk to Dr Perrin Maltese, he knows what works. Advised her I will ask Dr Clelia Croft. Patient is not reasonable at all. Is there anything else you can recommend? I have already advised RTC, she declines. She is angry, I am not sure how much I can help her.

## 2013-05-18 NOTE — Telephone Encounter (Signed)
PT STATES SHE ISN'T FEELING MUCH BETTER AND THE ANTIBIOTIC ISN'T HELPING, NEED TO HAVE SOMETHING ELSE CALLED IN PLEASE CALL PT AT [phone]   [facility] [address] IN [place]

## 2013-05-18 NOTE — Telephone Encounter (Signed)
It is difficult to choose an appropriate treatment w/o an evaluation but as she did a few days of doxy at the beginning perhaps it would be best for her to just finish out a full course of doxycycline. As with all bacterial and viral illnesses it can often take about 2 weeks for the body to fully recover and regain energy.  She is welcome to call Dr. Perrin Maltese personally if she would like but he is not in the office currently over the holidays. Perhaps she should come to see him the next time he is in the office so he can help her transition to another doctor before his retirement.

## 2013-05-29 ENCOUNTER — Other Ambulatory Visit: Payer: Self-pay | Admitting: Physician Assistant

## 2013-06-02 NOTE — Telephone Encounter (Signed)
Dr Brigitte Pulse, [place] to RF? You saw pt last for her sinusitis.

## 2013-07-17 ENCOUNTER — Ambulatory Visit (INDEPENDENT_AMBULATORY_CARE_PROVIDER_SITE_OTHER): Payer: BC Managed Care – PPO | Admitting: Family Medicine

## 2013-07-17 VITALS — BP 138/80 | HR 83 | Temp 98.0°F | Resp 16 | Ht 67.5 in | Wt 185.0 lb

## 2013-07-17 DIAGNOSIS — I10 Essential (primary) hypertension: Secondary | ICD-10-CM

## 2013-07-17 DIAGNOSIS — L678 Other hair color and hair shaft abnormalities: Secondary | ICD-10-CM

## 2013-07-17 DIAGNOSIS — L739 Follicular disorder, unspecified: Secondary | ICD-10-CM

## 2013-07-17 DIAGNOSIS — L738 Other specified follicular disorders: Secondary | ICD-10-CM

## 2013-07-17 MED ORDER — DOXYCYCLINE HYCLATE 100 MG PO TABS: 100.0000 mg | ORAL_TABLET | Freq: Two times a day (BID) | ORAL | Status: DC

## 2013-07-17 MED ORDER — AMLODIPINE BESYLATE 2.5 MG PO TABS: 2.5000 mg | ORAL_TABLET | Freq: Every day | ORAL | Status: DC

## 2013-07-17 NOTE — Progress Notes (Signed)
[facility] [address] [place] [address] [phone]  Date:  07/17/2013   Name:  Brooke Sullivan   DOB:  Nov 27, 1950   MRN:  [id number]  PCP:  Kennon Portela, MD    Chief Complaint: Adenopathy   History of Present Illness:  Brooke Sullivan is a 63 y.o. very pleasant female patient who presents with the following:  She has noted bumps under her right arm for about one week.  She had one, and then another 2 more came up a few days later. They are red, tender and sore.  She had tried to squeeze them but has not gotten any discharge.  She has not noted any other enlarged lymph nodes.  She has tried an OTC cream but it has not helped so far  She did have folliculitis last year.   She has felt well otherwise,  no fever.    She is also taking amlodipine for her HTN.    She would like to go ahead and refill this today if possible, she takes 2.5 mg  Patient Active Problem List   Diagnosis Date Noted  . Hypertension 02/20/2011  . DYSPNEA 06/20/2010  . CHEST TIGHTNESS-PRESSURE-OTHER 06/20/2010  . ARTHRITIS, HANDS, BILATERAL 06/15/2010    Past Medical History  Diagnosis Date  . Chest pain, atypical   . Arthritis     hands bilateral  . Hypertension   . Headache(784.0)   . Shortness of breath     with exertion  . RLD (ruptured lumbar disc)     Past Surgical History  Procedure Laterality Date  . Spine surgery    . Knee arthroscopy  02/22/2012    Procedure: ARTHROSCOPY KNEE;  Surgeon: Augustin Schooling, MD;  Location: [facility];  Service: Orthopedics;  Laterality: Left;  left knee arthroscopy    History  Substance Use Topics  . Smoking status: Former Smoker -- 1.00 packs/day for 20 years    Types: Cigarettes    Quit date: 09/19/1999  . Smokeless tobacco: Not on file  . Alcohol Use: 4.2 oz/week    7 Glasses of wine per week     Comment: socially    Family History  Problem Relation Age of Onset  . Heart failure Father 45    died. also heavy alcohol  use  . Depression Mother   . Osteoporosis Mother     Allergies  Allergen Reactions  . Augmentin [Amoxicillin-Pot Clavulanate] Diarrhea and Nausea And Vomiting  . Codeine Nausea Only    Makes patient sleep for a long time and nauseated  . Naproxen Hives and Swelling  . Sulfonamide Derivatives Other (See Comments)    Yeast infection.    Medication list has been reviewed and updated.  Current Outpatient Prescriptions on File Prior to Visit  Medication Sig Dispense Refill  . benzonatate (TESSALON PERLES) 100 MG capsule Take 1-2 capsules (100-200 mg total) by mouth 3 (three) times daily as needed for cough.  60 capsule  0  . Coral Calcium 500 MG TABS Take 500 mg by mouth 2 (two) times daily.      Marland Kitchen ibuprofen (ADVIL,MOTRIN) 200 MG tablet Take 400 mg by mouth every 6 (six) hours as needed.      Marland Kitchen ipratropium (ATROVENT) 0.03 % nasal spray PLACE 2 SPRAYS INTO BOTH NOSTRILS 2 (TWO) TIMES DAILY.  30 mL  2  . omeprazole (PRILOSEC) 20 MG capsule Take 20 mg by mouth daily as needed. For indigestion.      Marland Kitchen  OVER THE COUNTER MEDICATION Take 1 tablet by mouth daily. [employer] brand energy-booster.      . triamcinolone cream (KENALOG) 0.1 % Apply topically 2 (two) times daily. Do not use on face or genitals, apply bid till itchy rash gone prn  454 g  1  . azithromycin (ZITHROMAX) 250 MG tablet Take 2 tabs PO x 1 dose, then 1 tab PO QD x 4 days  6 tablet  0  . doxycycline (VIBRA-TABS) 100 MG tablet Take 1 tablet (100 mg total) by mouth 2 (two) times daily.  20 tablet  0  . Guaifenesin (MUCINEX MAXIMUM STRENGTH) 1200 MG TB12 Take 1 tablet (1,200 mg total) by mouth every 12 (twelve) hours as needed.  14 tablet  1   No current facility-administered medications on file prior to visit.    Review of Systems:  As per HPI- otherwise negative.   Physical Examination: Filed Vitals:   07/17/13 1812  BP: 138/80  Pulse: 83  Temp: 98 F (36.7 C)  Resp: 16   Filed Vitals:   07/17/13 1812  Height: 5' 7.5"  (1.715 m)  Weight: 185 lb (83.915 kg)   Body mass index is 28.53 kg/(m^2). Ideal Body Weight: Weight in (lb) to have BMI = 25: 161.7  GEN: WDWN, NAD, Non-toxic, A & O x 3, overweight, looks well HEENT: Atraumatic, Normocephalic. Neck supple. No masses, No LAD.  Bilateral TM wnl, oropharynx normal.  PEERL,EOMI.  No LAD of the upper body Ears and Nose: No external deformity. CV: RRR, No M/G/R. No JVD. No thrill. No extra heart sounds. PULM: CTA B, no wheezes, crackles, rhonchi. No retractions. No resp. distress. No accessory muscle use. ABD: S, NT, ND, +BS. No rebound. No HSM. EXTR: No c/c/e NEURO Normal gait.  PSYCH: Normally interactive. Conversant. Not depressed or anxious appearing.  Calm demeanor.  Right axilla shows 3 or 4 small, tender and indurated areas, one of which is fluctuant and has a head.  They are all small, less than pea sized.  Other areas are not fluctuant.    VC obtained.  Area prepped with betadine and alcohol.  Anesthesia with 1% lidocaine and performed I and D with 15 blade- incision about 1/2 cm in length.  Pus expressed. Wound dressed but no packing needed   Assessment and Plan: Folliculitis - Plan: doxycycline (VIBRA-TABS) 100 MG tablet  HTN (hypertension) - Plan: Basic metabolic panel, amLODipine (NORVASC) 2.5 MG tablet  Folliculitis under right arm.  Worst area I and D today, treat with doxycycline and hot compresses Refilled her norvasc and check BMP today  Signed Lamar Blinks, MD

## 2013-07-17 NOTE — Patient Instructions (Signed)
Use the doxycycline twice a day for 10 days. Use hot compresses under your arm.  Let me know if you are not better in the next couple of days- Sooner if worse.   I will let you know your lab results  Continue to use your amlodipine for your blood pressure.  You can fill your rx when you need it

## 2013-07-18 ENCOUNTER — Encounter: Payer: Self-pay | Admitting: Family Medicine

## 2013-07-18 LAB — BASIC METABOLIC PANEL
BUN: 12 mg/dL (ref 6–23)
CO2: 31 mEq/L (ref 19–32)
Calcium: 9.5 mg/dL (ref 8.4–10.5)
Chloride: 103 mEq/L (ref 96–112)
Creat: 0.62 mg/dL (ref 0.50–1.10)
GLUCOSE: 120 mg/dL — AB (ref 70–99)
Potassium: 4.4 mEq/L (ref 3.5–5.3)
SODIUM: 138 meq/L (ref 135–145)

## 2013-07-22 ENCOUNTER — Other Ambulatory Visit: Payer: Self-pay | Admitting: Internal Medicine

## 2013-07-27 ENCOUNTER — Telehealth: Payer: Self-pay

## 2013-07-27 NOTE — Telephone Encounter (Signed)
Called in Rx w/note pt needs OV for more RFs.

## 2013-07-27 NOTE — Telephone Encounter (Signed)
Called in Rx and notified pt done and advised needs ov for more.pt agreed.

## 2013-07-27 NOTE — Telephone Encounter (Signed)
90 rfs ok, needs to rtc for next rf

## 2013-07-27 NOTE — Telephone Encounter (Signed)
Patient is saying that pharmacy has been faxing [place] for a week for refills please call patient at [phone]

## 2013-08-05 ENCOUNTER — Telehealth: Payer: Self-pay

## 2013-08-05 NOTE — Telephone Encounter (Signed)
Pt has completed the medication and the problem under her arm has not gone away the one that was lanced has not go down in size either  Best number [phone]

## 2013-08-06 ENCOUNTER — Ambulatory Visit (INDEPENDENT_AMBULATORY_CARE_PROVIDER_SITE_OTHER): Payer: BC Managed Care – PPO | Admitting: Emergency Medicine

## 2013-08-06 VITALS — BP 122/60 | HR 78 | Temp 97.8°F | Resp 18 | Ht 68.0 in | Wt 185.2 lb

## 2013-08-06 DIAGNOSIS — L739 Follicular disorder, unspecified: Secondary | ICD-10-CM

## 2013-08-06 DIAGNOSIS — L738 Other specified follicular disorders: Secondary | ICD-10-CM

## 2013-08-06 DIAGNOSIS — L678 Other hair color and hair shaft abnormalities: Secondary | ICD-10-CM

## 2013-08-06 MED ORDER — CLINDAMYCIN HCL 300 MG PO CAPS: 300.0000 mg | ORAL_CAPSULE | Freq: Three times a day (TID) | ORAL | Status: DC

## 2013-08-06 NOTE — Patient Instructions (Signed)
Folliculitis  Folliculitis is redness, soreness, and swelling (inflammation) of the hair follicles. This condition can occur anywhere on the body. People with weakened immune systems, diabetes, or obesity have a greater risk of getting folliculitis. CAUSES  Bacterial infection. This is the most common cause.  Fungal infection.  Viral infection.  Contact with certain chemicals, especially oils and tars. Long-term folliculitis can result from bacteria that live in the nostrils. The bacteria may trigger multiple outbreaks of folliculitis over time. SYMPTOMS Folliculitis most commonly occurs on the scalp, thighs, legs, back, buttocks, and areas where hair is shaved frequently. An early sign of folliculitis is a small, white or yellow, pus-filled, itchy lesion (pustule). These lesions appear on a red, inflamed follicle. They are usually less than 0.2 inches (5 mm) wide. When there is an infection of the follicle that goes deeper, it becomes a boil or furuncle. A group of closely packed boils creates a larger lesion (carbuncle). Carbuncles tend to occur in hairy, sweaty areas of the body. DIAGNOSIS  Your caregiver can usually tell what is wrong by doing a physical exam. A sample may be taken from one of the lesions and tested in a lab. This can help determine what is causing your folliculitis. TREATMENT  Treatment may include:  Applying warm compresses to the affected areas.  Taking antibiotic medicines orally or applying them to the skin.  Draining the lesions if they contain a large amount of pus or fluid.  Laser hair removal for cases of long-lasting folliculitis. This helps to prevent regrowth of the hair. HOME CARE INSTRUCTIONS  Apply warm compresses to the affected areas as directed by your caregiver.  If antibiotics are prescribed, take them as directed. Finish them even if you start to feel better.  You may take over-the-counter medicines to relieve itching.  Do not shave  irritated skin.  Follow up with your caregiver as directed. SEEK IMMEDIATE MEDICAL CARE IF:   You have increasing redness, swelling, or pain in the affected area.  You have a fever. MAKE SURE YOU:  Understand these instructions.  Will watch your condition.  Will get help right away if you are not doing well or get worse. Document Released: 07/16/2001 Document Revised: 11/06/2011 Document Reviewed: 08/07/2011 ExitCare Patient Information 2014 [employer].  

## 2013-08-06 NOTE — Progress Notes (Signed)
[facility] [address], [place] [address] [phone]  Date:  08/06/2013   Name:  Brooke Sullivan   DOB:  June 14, 1950   MRN:  [id number]  PCP:  Kennon Portela, MD    Chief Complaint: bumps   History of Present Illness:  Brooke Sullivan is a 63 y.o. very pleasant female patient who presents with the following:  Seen 12 days ago with several abscesses in her right axilla.  She was treated with doxy and one of the abscesses was drained but no culture was done.  She is out of medication and has a new and larger mass inferior to the other three.  No fever or chills.  No improvement with over the counter medications or other home remedies. Denies other complaint or health concern today.   Patient Active Problem List   Diagnosis Date Noted  . Hypertension 02/20/2011  . DYSPNEA 06/20/2010  . CHEST TIGHTNESS-PRESSURE-OTHER 06/20/2010  . ARTHRITIS, HANDS, BILATERAL 06/15/2010    Past Medical History  Diagnosis Date  . Chest pain, atypical   . Arthritis     hands bilateral  . Hypertension   . Headache(784.0)   . Shortness of breath     with exertion  . RLD (ruptured lumbar disc)     Past Surgical History  Procedure Laterality Date  . Spine surgery    . Knee arthroscopy  02/22/2012    Procedure: ARTHROSCOPY KNEE;  Surgeon: Augustin Schooling, MD;  Location: [facility];  Service: Orthopedics;  Laterality: Left;  left knee arthroscopy    History  Substance Use Topics  . Smoking status: Former Smoker -- 1.00 packs/day for 20 years    Types: Cigarettes    Quit date: 09/19/1999  . Smokeless tobacco: Not on file  . Alcohol Use: 4.2 oz/week    7 Glasses of wine per week     Comment: socially    Family History  Problem Relation Age of Onset  . Heart failure Father 8    died. also heavy alcohol use  . Depression Mother   . Osteoporosis Mother     Allergies  Allergen Reactions  . Augmentin [Amoxicillin-Pot Clavulanate] Diarrhea and Nausea And Vomiting  .  Codeine Nausea Only    Makes patient sleep for a long time and nauseated  . Naproxen Hives and Swelling  . Sulfonamide Derivatives Other (See Comments)    Yeast infection.    Medication list has been reviewed and updated.  Current Outpatient Prescriptions on File Prior to Visit  Medication Sig Dispense Refill  . amLODipine (NORVASC) 2.5 MG tablet Take 1 tablet (2.5 mg total) by mouth daily.  90 tablet  3  . benzonatate (TESSALON PERLES) 100 MG capsule Take 1-2 capsules (100-200 mg total) by mouth 3 (three) times daily as needed for cough.  60 capsule  0  . Coral Calcium 500 MG TABS Take 500 mg by mouth 2 (two) times daily.      Marland Kitchen ibuprofen (ADVIL,MOTRIN) 200 MG tablet Take 400 mg by mouth every 6 (six) hours as needed.      Marland Kitchen omeprazole (PRILOSEC) 20 MG capsule Take 20 mg by mouth daily as needed. For indigestion.      Marland Kitchen OVER THE COUNTER MEDICATION Take 1 tablet by mouth daily. [place] brand energy-booster.      . zolpidem (AMBIEN) 10 MG tablet TAKE 1 TABLET AT BEDTIME AS NEEDED SLEEP  90 tablet  0   No current facility-administered medications on file  prior to visit.    Review of Systems:  As per HPI, otherwise negative.    Physical Examination: Filed Vitals:   08/06/13 1702  BP: 122/60  Pulse: 78  Temp: 97.8 F (36.6 C)  Resp: 18   Filed Vitals:   08/06/13 1702  Height: 5\' 8"  (1.727 m)  Weight: 185 lb 3.2 oz (84.006 kg)   Body mass index is 28.17 kg/(m^2). Ideal Body Weight: Weight in (lb) to have BMI = 25: 164.1   GEN: WDWN, NAD, Non-toxic, Alert & Oriented x 3 HEENT: Atraumatic, Normocephalic.  Ears and Nose: No external deformity. EXTR: No clubbing/cyanosis/edema NEURO: Normal gait.  PSYCH: Normally interactive. Conversant. Not depressed or anxious appearing.  Calm demeanor.  Right axilla:  Three resolving abscess and one larger lesion that is not mature.     Assessment and Plan: Folliculitis  Change to clindamycin Local heat   Signed,  Ellison Carwin,  MD

## 2013-08-06 NOTE — Telephone Encounter (Signed)
Called her back- she had an I and D about 3 weeks ago, still notes some sx under her arm with tender areas, no pus noted.  She will come in for a recheck tomorrow- cannot come in today

## 2013-10-18 ENCOUNTER — Ambulatory Visit: Payer: BC Managed Care – PPO

## 2013-10-18 ENCOUNTER — Ambulatory Visit (INDEPENDENT_AMBULATORY_CARE_PROVIDER_SITE_OTHER): Payer: BC Managed Care – PPO | Admitting: Family Medicine

## 2013-10-18 VITALS — BP 120/70 | HR 81 | Temp 97.4°F | Resp 16 | Ht 68.5 in | Wt 184.0 lb

## 2013-10-18 DIAGNOSIS — R1032 Left lower quadrant pain: Secondary | ICD-10-CM

## 2013-10-18 LAB — POCT URINALYSIS DIPSTICK
Bilirubin, UA: NEGATIVE
Blood, UA: NEGATIVE
GLUCOSE UA: NEGATIVE
Ketones, UA: NEGATIVE
LEUKOCYTES UA: NEGATIVE
NITRITE UA: NEGATIVE
Protein, UA: NEGATIVE
Spec Grav, UA: 1.01
Urobilinogen, UA: 0.2
pH, UA: 7

## 2013-10-18 LAB — POCT UA - MICROSCOPIC ONLY
Bacteria, U Microscopic: NEGATIVE
Casts, Ur, LPF, POC: NEGATIVE
Crystals, Ur, HPF, POC: NEGATIVE
Mucus, UA: NEGATIVE
RBC, urine, microscopic: NEGATIVE
WBC, UR, HPF, POC: NEGATIVE
YEAST UA: NEGATIVE

## 2013-10-18 MED ORDER — DICYCLOMINE HCL 10 MG PO CAPS: 10.0000 mg | ORAL_CAPSULE | Freq: Three times a day (TID) | ORAL | Status: DC

## 2013-10-18 NOTE — Progress Notes (Signed)
History and physical exam reviewed in detail with Harrison Mons, PA-C.  AAS reviewed.  Agree with A/P.

## 2013-10-18 NOTE — Progress Notes (Signed)
Subjective:    Patient ID: Brooke Sullivan, female    DOB: 23-Jun-1950, 63 y.o.   MRN: [id number]   PCP: Kennon Portela, MD  Chief Complaint  Patient presents with  . Abdominal Pain    lower left side x 3 days    Medications, allergies, past medical history, surgical history, family history, social history and problem list reviewed and updated.  Patient Active Problem List   Diagnosis Date Noted  . Hypertension 02/20/2011  . DYSPNEA 06/20/2010  . CHEST TIGHTNESS-PRESSURE-OTHER 06/20/2010  . ARTHRITIS, HANDS, BILATERAL 06/15/2010    Prior to Admission medications   Medication Sig Start Date End Date Taking? Authorizing Provider  amLODipine (NORVASC) 2.5 MG tablet Take 1 tablet (2.5 mg total) by mouth daily. 07/17/13  Yes Gay Filler Copland, MD  Coral Calcium 500 MG TABS Take 500 mg by mouth 2 (two) times daily.   Yes Historical Provider, MD  ibuprofen (ADVIL,MOTRIN) 200 MG tablet Take 400 mg by mouth every 6 (six) hours as needed.   Yes Historical Provider, MD  omeprazole (PRILOSEC) 20 MG capsule Take 20 mg by mouth daily as needed. For indigestion.   Yes Historical Provider, MD  OVER THE COUNTER MEDICATION Take 1 tablet by mouth daily. [place] brand energy-booster.   Yes Historical Provider, MD  zolpidem (AMBIEN) 10 MG tablet TAKE 1 TABLET AT BEDTIME AS NEEDED SLEEP   Yes Orma Flaming, MD     HPI  Hervey Ard LLQ abdominal pain, "almost like full of gas that you can't get rid of" beginning Thursday 10/15/2013 in the evening.  Took a shower, ate supper and went to bed as usual.  The next morning did her usual routine, very active, and on the ride home from [place] was "so uncomfortable."    Didn't eat supper.  Felt a little nausea.  Rested on the couch.  Ate 2 scrambled eggs, a piece of toast, 1/2 cup of milk, water. Chills, fever.  Took 800 mg ibuprofen, went to bed. Ate eggs again yesterday, felt some better.  No fever/chills yesterday.  LEFT sided abdominal area, with pain when she sits  or rises from sitting.  Normal BMs Thursday and Friday. Recently developed hemorrhoids, which she's treating with Preparation H. Yesterday, no BM, but felt very bloated.  Drank a cup of coffee to try to go, but just had a small stool.  This morning, had 2 cups of coffee, and had a normal BM with minimal improvement.  Review of Systems No CP, SOB.  No new muscle or joint pain.  No urinary symptoms.    Objective:   Physical Exam  Constitutional: She is oriented to person, place, and time. She appears well-developed and well-nourished. No distress (but obviously uncomfortable).  BP 120/70  Pulse 81  Temp(Src) 97.4 F (36.3 C) (Oral)  Resp 16  Ht 5' 8.5" (1.74 m)  Wt 184 lb (83.462 kg)  BMI 27.57 kg/m2  SpO2 96%   Eyes: Conjunctivae are normal. No scleral icterus.  Cardiovascular: Normal rate, regular rhythm and normal heart sounds.   Pulmonary/Chest: Effort normal and breath sounds normal.  Abdominal: Soft. Bowel sounds are normal. She exhibits no distension and no mass. Tenderness: not painful, but feels increased pressure/"uncomfortable" with palpation on the LEFT. There is no rebound and no guarding.  Neurological: She is alert and oriented to person, place, and time.  Skin: Skin is warm and dry. No rash noted.  Psychiatric: She has a normal mood and affect. Her behavior is normal.  Acute Abdominal Series: [facility] reading (PRIMARY) by  Dr. Tamala Julian.  Nonspecific bowel gas pattern.  No acute findings.  Results for orders placed in visit on 10/18/13  POCT UA - MICROSCOPIC ONLY      Result Value Ref Range   WBC, Ur, HPF, POC neg     RBC, urine, microscopic neg     Bacteria, U Microscopic neg     Mucus, UA neg     Epithelial cells, urine per micros 0-3     Crystals, Ur, HPF, POC neg     Casts, Ur, LPF, POC neg     Yeast, UA neg    POCT URINALYSIS DIPSTICK      Result Value Ref Range   Color, UA yellow     Clarity, UA clear     Glucose, UA neg     Bilirubin, UA neg     Ketones,  UA neg     Spec Grav, UA 1.010     Blood, UA neg     pH, UA 7.0     Protein, UA neg     Urobilinogen, UA 0.2     Nitrite, UA neg     Leukocytes, UA Negative            Assessment & Plan:  1. LLQ abdominal pain Suspect constipation.  Anticipatory guidance provided.  OTC Miralax and Simethicone. If symptoms persist, may need CT abd/pelvis. - DG Abd Acute W/Chest - POCT UA - Microscopic Only - POCT urinalysis dipstick - dicyclomine (BENTYL) 10 MG capsule; Take 1-2 capsules (10-20 mg total) by mouth 4 (four) times daily -  before meals and at bedtime.  Dispense: 30 capsule; Refill: 0   Fara Chute, PA-C Physician Assistant-Certified [facility]

## 2013-10-18 NOTE — Patient Instructions (Signed)
Try Miralax and Simethicone. Your pain and discomfort should resolve in the next several days. If not, or if it worsens, please let me know.  You may need re-evaluation or additional testing.

## 2014-01-01 ENCOUNTER — Other Ambulatory Visit: Payer: Self-pay | Admitting: Emergency Medicine

## 2014-07-14 ENCOUNTER — Other Ambulatory Visit: Payer: Self-pay | Admitting: Family Medicine

## 2014-09-08 ENCOUNTER — Other Ambulatory Visit: Payer: Self-pay | Admitting: Physician Assistant

## 2014-09-20 ENCOUNTER — Other Ambulatory Visit: Payer: Self-pay | Admitting: Physician Assistant

## 2014-10-15 ENCOUNTER — Encounter: Payer: Self-pay | Admitting: *Deleted

## 2014-10-17 ENCOUNTER — Other Ambulatory Visit: Payer: Self-pay | Admitting: Internal Medicine

## 2014-10-19 NOTE — Telephone Encounter (Signed)
Refill amlodipine 3 months, refill zolpidem 10mg  15 pills, have her rtc and see me as soon as she can.

## 2014-10-21 NOTE — Telephone Encounter (Signed)
Pharm called and I gave her zolpidem RF over the phone. Asked her to let pt know that she needs to RTC for more RFs. Pharm reported that she has been notified last couple of months by our notes and pt's remark was "I don't know what that office's problem is, I have a relationship with Dr Elder Cyphers, not the rest of those people". Pharm agreed to tell pt that "PER DR GUEST, HE NEEDS TO SEE HER FOR MORE REFILLS". LMOM for pt w/this message also.

## 2014-12-08 ENCOUNTER — Telehealth: Payer: Self-pay

## 2014-12-08 NOTE — Telephone Encounter (Signed)
PA completed on covermymeds for zolpidem 10 mg. Pending.

## 2014-12-09 NOTE — Telephone Encounter (Signed)
PA approved through 05/21/15. Notified pharm. 

## 2014-12-31 ENCOUNTER — Telehealth: Payer: Self-pay | Admitting: *Deleted

## 2014-12-31 NOTE — Telephone Encounter (Signed)
Phoned & reached patient to discuss mammogram & is unclear on what medicare/medicaid will cover (she just turned 63 and transitioned into it).  She is going to check into what exactly is covered and even though I gave her my direct number, she asked that I follow up with her in ~ a week.

## 2015-12-17 ENCOUNTER — Ambulatory Visit (INDEPENDENT_AMBULATORY_CARE_PROVIDER_SITE_OTHER): Payer: Medicare HMO | Admitting: Physician Assistant

## 2015-12-17 VITALS — BP 114/72 | HR 89 | Temp 97.7°F | Resp 18 | Ht 68.5 in | Wt 185.2 lb

## 2015-12-17 DIAGNOSIS — R42 Dizziness and giddiness: Secondary | ICD-10-CM | POA: Diagnosis not present

## 2015-12-17 DIAGNOSIS — G4700 Insomnia, unspecified: Secondary | ICD-10-CM | POA: Insufficient documentation

## 2015-12-17 DIAGNOSIS — Z23 Encounter for immunization: Secondary | ICD-10-CM | POA: Diagnosis not present

## 2015-12-17 LAB — POCT CBC
Granulocyte percent: 64.1 %G (ref 37–80)
HEMATOCRIT: 40.2 % (ref 37.7–47.9)
HEMOGLOBIN: 14.3 g/dL (ref 12.2–16.2)
Lymph, poc: 1.8 (ref 0.6–3.4)
MCH, POC: 30.8 pg (ref 27–31.2)
MCHC: 35.5 g/dL — AB (ref 31.8–35.4)
MCV: 86.7 fL (ref 80–97)
MID (CBC): 0.4 (ref 0–0.9)
MPV: 7.3 fL (ref 0–99.8)
POC Granulocyte: 4.1 (ref 2–6.9)
POC LYMPH %: 28.9 % (ref 10–50)
POC MID %: 7 % (ref 0–12)
Platelet Count, POC: 242 10*3/uL (ref 142–424)
RBC: 4.64 M/uL (ref 4.04–5.48)
RDW, POC: 13.6 %
WBC: 6.4 10*3/uL (ref 4.6–10.2)

## 2015-12-17 LAB — POCT URINALYSIS DIP (MANUAL ENTRY)
Bilirubin, UA: NEGATIVE
Blood, UA: NEGATIVE
GLUCOSE UA: NEGATIVE
Ketones, POC UA: NEGATIVE
Leukocytes, UA: NEGATIVE
NITRITE UA: NEGATIVE
PH UA: 5.5
Protein Ur, POC: NEGATIVE
Spec Grav, UA: 1.01
Urobilinogen, UA: 0.2

## 2015-12-17 LAB — POC MICROSCOPIC URINALYSIS (UMFC): Mucus: ABSENT

## 2015-12-17 MED ORDER — ZOSTER VACCINE LIVE 19400 UNT/0.65ML ~~LOC~~ SUSR: 0.6500 mL | Freq: Once | SUBCUTANEOUS | 0 refills | Status: AC

## 2015-12-17 MED ORDER — ZOLPIDEM TARTRATE 10 MG PO TABS: 10.0000 mg | ORAL_TABLET | Freq: Every evening | ORAL | 0 refills | Status: DC | PRN

## 2015-12-17 NOTE — Patient Instructions (Addendum)
Do the Epley Maneuver at home three times a day until you are free of symptoms for 24 hours. Consider going back to see Dr. Laveda Abbe. Use the meclizine if needed.    IF you received an x-ray today, you will receive an invoice from [facility]. Please contact [facility] at [phone] with questions or concerns regarding your invoice.   IF you received labwork today, you will receive an invoice from [employer]. Please contact [facility] at [phone] with questions or concerns regarding your invoice.   Our billing staff will not be able to assist you with questions regarding bills from these companies.  You will be contacted with the lab results as soon as they are available. The fastest way to get your results is to activate your My Chart account. Instructions are located on the last page of this paperwork. If you have not heard from [place] regarding the results in 2 weeks, please contact this office.     We recommend that you schedule a mammogram for breast cancer screening. Typically, you do not need a referral to do this. Please contact a local imaging center to schedule your mammogram.  [facility] - [phone]  *ask for the Radiology Department [facility] - [phone] or [phone]  [facility] - [phone] [place] [phone] [facility] - [phone]  *ask for the [facility] - [phone]  *ask for the Radiology Department [facility] - [phone]  *ask for the [place] - [phone]

## 2015-12-17 NOTE — Progress Notes (Signed)
Patient ID: Brooke Sullivan, female    DOB: Jul 30, 1950, 65 y.o.   MRN: [id number]  PCP: Kennon Portela, MD  Subjective:   Chief Complaint  Patient presents with  . Dizziness    HPI Presents for evaluation of "vertigo.".  Last seen here 09/2013. Her husband lost his job, and they were without health insurance for a while. 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  A week ago today she awoke at 3 am (I sleep on my left side) with a sharp pain in the LEFT ear. Got up to urinate. The pain resolved. She went back to sleep, on her RIGHT side. Awoke at 7 am with vertigo. Spinning. Thought she was just "light headed," so had something to eat. Didn't help. She called a friend, who had previously had vertigo, who told her to get OTC meclizine.  Because she has a history of odd reactions to new medications, she took 1 tablet about 9:30 am. In 30 minutes, she was asleep. "I felt like molten lead, poured onto the couch." She slept for 2 hours. Upon awakening, she felt good, but when she stood up, the symptoms recurred. She took a second dose and went back to sleep. Her husband did the dog transport she was scheduled to run that afternoon.  The next day she took 2 doses of meclizine and seemed to be improving. She walked around with a spine-backed chair in case she needed to sit down suddenly.  Monday 12/12/2015 morning she was feeling good and was leaving home to go to work in [place]. As she was leaving the house the symptoms recurred. She took more meclizine and slept all day.  On Tuesday, she followed You Tube directions for the home vertigo exercises ("the one where you stand on your head and turn. It's really hard."), with benefit until she tried climbing stairs.  Then another friend recommended that she see her chiropractor, who did an adjustment, with resolution. On Wednesday morning, she felt fair. Went on to work.  Feels really lightheaded when she goes up  and down stairs, when she leans over and stands back up. Feels fine as long as she is still, or walking on flat surface. 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 Has not taken any meclizine since Tuesday morning.  No hearing loss. No headache. No nausea, vomiting. No visual disturbance.  LBP and LEFT hip pain, for which she sees her chiropractor monthly. She has known ruptured disc in the low back years ago.    Review of Systems As above.     Patient Active Problem List   Diagnosis Date Noted  . Hypertension 02/20/2011  . DYSPNEA 06/20/2010  . CHEST TIGHTNESS-PRESSURE-OTHER 06/20/2010  . 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 Authorizing Provider  amLODipine (NORVASC) 2.5 MG tablet TAKE 1 TABLET (2.5 MG TABLET) BY MOUTH DAILY.  "OV NEEDED FOR ADDITIONAL REFILLS" 07/14/14  Yes Ivory Bail, PA-C  Coral Calcium 500 MG TABS Take 500 mg by mouth 2 (two) times daily.   Yes Historical Provider, MD  ibuprofen (ADVIL,MOTRIN) 200 MG tablet Take 400 mg by mouth every 6 (six) hours as needed.   Yes Historical Provider, MD  OVER THE COUNTER MEDICATION Take 1 tablet by mouth daily. [place] brand energy-booster.   Yes Historical Provider, MD  zolpidem (AMBIEN) 10 MG tablet Take 1 tablet (10 mg total)  by mouth at bedtime as needed for sleep. NO MORE REFILLS WITHOUT OFFICE VISIT - 2ND NOTICE 10/21/14  Yes Orma Flaming, MD  amLODipine (NORVASC) 2.5 MG tablet Take 1 tablet (2.5 mg total) by mouth daily. Patient not taking: Reported on 12/17/2015 07/17/13   Gay Filler Copland, MD  amLODipine (NORVASC) 2.5 MG tablet Take 1 tablet (2.5 mg total) by mouth daily. PER DR GUEST - NO MORE REFILLS W/OUT OFFICE VISIT Patient not taking: Reported on 12/17/2015 10/21/14   Orma Flaming, MD  dicyclomine (BENTYL) 10 MG capsule Take 1-2 capsules (10-20 mg total) by mouth 4 (four) times daily -  before meals and at  bedtime. Patient not taking: Reported on 12/17/2015 10/18/13   Harrison Mons, PA-C  omeprazole (PRILOSEC) 20 MG capsule Take 20 mg by mouth daily as needed. For indigestion.    Historical Provider, MD     Allergies  Allergen Reactions  . Augmentin [Amoxicillin-Pot Clavulanate] Diarrhea and Nausea And Vomiting  . Codeine Nausea Only    Makes patient sleep for a long time and nauseated  . Naproxen Hives and Swelling  . Sulfonamide Derivatives Other (See Comments)    Yeast infection.       Objective:  Physical Exam  Constitutional: She is oriented to person, place, and time. She appears well-developed and well-nourished. She is active and cooperative. No distress.  BP 114/72   Pulse 89   Temp 97.7 F (36.5 C) (Oral)   Resp 18   Ht 5' 8.5" (1.74 m)   Wt 185 lb 3.2 oz (84 kg)   SpO2 97%   BMI 27.75 kg/m   HENT:  Head: Normocephalic and atraumatic.  Right Ear: Hearing normal.  Left Ear: Hearing normal.  Eyes: Conjunctivae are normal. No scleral icterus.  Neck: Normal range of motion. Neck supple. No thyromegaly present.  Cardiovascular: Normal rate, regular rhythm and normal heart sounds.   Pulses:      Radial pulses are 2+ on the right side, and 2+ on the left side.  Pulmonary/Chest: Effort normal and breath sounds normal.  Lymphadenopathy:       Head (right side): No tonsillar, no preauricular, no posterior auricular and no occipital adenopathy present.       Head (left side): No tonsillar, no preauricular, no posterior auricular and no occipital adenopathy present.    She has no cervical adenopathy.       Right: No supraclavicular adenopathy present.       Left: No supraclavicular adenopathy present.  Neurological: She is alert and oriented to person, place, and time. She displays normal reflexes. No cranial nerve deficit or sensory deficit. Coordination normal.  Skin: Skin is warm, dry and intact. No rash noted. No cyanosis or erythema. Nails show no clubbing.    Psychiatric: She has a normal mood and affect. Her speech is normal and behavior is normal.     Results for orders placed or performed in visit on 12/17/15  POCT CBC  Result Value Ref Range   WBC 6.4 4.6 - 10.2 K/uL   Lymph, poc 1.8 0.6 - 3.4   POC LYMPH PERCENT 28.9 10 - 50 %L   MID (cbc) 0.4 0 - 0.9   POC MID % 7.0 0 - 12 %M   POC Granulocyte 4.1 2 - 6.9   Granulocyte percent 64.1 37 - 80 %G   RBC 4.64 4.04 - 5.48 M/uL   Hemoglobin 14.3 12.2 - 16.2 g/dL   HCT, POC 40.2 37.7 - 47.9 %  MCV 86.7 80 - 97 fL   MCH, POC 30.8 27 - 31.2 pg   MCHC 35.5 (A) 31.8 - 35.4 g/dL   RDW, POC 13.6 %   Platelet Count, POC 242 142 - 424 K/uL   MPV 7.3 0 - 99.8 fL  POCT urinalysis dipstick  Result Value Ref Range   Color, UA yellow yellow   Clarity, UA clear clear   Glucose, UA negative negative   Bilirubin, UA negative negative   Ketones, POC UA negative negative   Spec Grav, UA 1.010    Blood, UA negative negative   pH, UA 5.5    Protein Ur, POC negative negative   Urobilinogen, UA 0.2    Nitrite, UA Negative Negative   Leukocytes, UA Negative Negative  POCT Microscopic Urinalysis ([facility])  Result Value Ref Range   WBC,UR,HPF,POC None None WBC/hpf   RBC,UR,HPF,POC None None RBC/hpf   Bacteria None None, Too numerous to count   Mucus Absent Absent   Epithelial Cells, UR Per Microscopy None None, Too numerous to count cells/hpf    Dix-Halpike maneuver does not cause nystagmus to either side.     Assessment & Plan:   1. Vertigo Recommend home modified Epley maneuver, BID until she is symptoms free x 24 hours. Since it worked, also consider going back to her chiropractor. - POCT CBC - POCT urinalysis dipstick - POCT Microscopic Urinalysis ([facility])  2. Insomnia Stable. She still has 4 tablets left from the prescription 12 months ago. - zolpidem (AMBIEN) 10 MG tablet; Take 1 tablet (10 mg total) by mouth at bedtime as needed for sleep.  Dispense: 30 tablet; Refill: 0  3. Need for  shingles vaccine - Zoster Vaccine Live, PF, (ZOSTAVAX) 91478 UNT/0.65ML injection; Inject 19,400 Units into the skin once.  Dispense: 0.65 mL; Refill: 0    Return for Wellness Visit .    Fara Chute, PA-C Physician Assistant-Certified [facility]

## 2015-12-18 ENCOUNTER — Encounter: Payer: Self-pay | Admitting: Physician Assistant

## 2016-05-22 ENCOUNTER — Ambulatory Visit (INDEPENDENT_AMBULATORY_CARE_PROVIDER_SITE_OTHER): Payer: Medicare HMO

## 2016-05-22 ENCOUNTER — Ambulatory Visit (INDEPENDENT_AMBULATORY_CARE_PROVIDER_SITE_OTHER): Payer: Medicare HMO | Admitting: Family Medicine

## 2016-05-22 VITALS — BP 147/73 | HR 81 | Temp 98.2°F | Resp 16 | Ht 68.8 in | Wt 188.0 lb

## 2016-05-22 DIAGNOSIS — W19XXXA Unspecified fall, initial encounter: Secondary | ICD-10-CM

## 2016-05-22 DIAGNOSIS — S76112A Strain of left quadriceps muscle, fascia and tendon, initial encounter: Secondary | ICD-10-CM

## 2016-05-22 DIAGNOSIS — M25462 Effusion, left knee: Secondary | ICD-10-CM

## 2016-05-22 DIAGNOSIS — M25562 Pain in left knee: Secondary | ICD-10-CM | POA: Diagnosis not present

## 2016-05-22 DIAGNOSIS — S8992XA Unspecified injury of left lower leg, initial encounter: Secondary | ICD-10-CM | POA: Diagnosis not present

## 2016-05-22 DIAGNOSIS — S8002XA Contusion of left knee, initial encounter: Secondary | ICD-10-CM

## 2016-05-22 DIAGNOSIS — S80212A Abrasion, left knee, initial encounter: Secondary | ICD-10-CM | POA: Diagnosis not present

## 2016-05-22 DIAGNOSIS — M7989 Other specified soft tissue disorders: Secondary | ICD-10-CM | POA: Diagnosis not present

## 2016-05-22 LAB — POCT CBC
GRANULOCYTE PERCENT: 67.9 % (ref 37–80)
HEMATOCRIT: 40.8 % (ref 37.7–47.9)
Hemoglobin: 14.5 g/dL (ref 12.2–16.2)
Lymph, poc: 1.6 (ref 0.6–3.4)
MCH, POC: 31 pg (ref 27–31.2)
MCHC: 35.6 g/dL — AB (ref 31.8–35.4)
MCV: 87.2 fL (ref 80–97)
MID (cbc): 0.5 (ref 0–0.9)
MPV: 6.9 fL (ref 0–99.8)
PLATELET COUNT, POC: 271 10*3/uL (ref 142–424)
POC GRANULOCYTE: 4.5 (ref 2–6.9)
POC LYMPH %: 24.3 % (ref 10–50)
POC MID %: 7.8 %M (ref 0–12)
RBC: 4.68 M/uL (ref 4.04–5.48)
RDW, POC: 13.3 %
WBC: 6.6 10*3/uL (ref 4.6–10.2)

## 2016-05-22 MED ORDER — DOXYCYCLINE HYCLATE 100 MG PO CAPS: 100.0000 mg | ORAL_CAPSULE | Freq: Two times a day (BID) | ORAL | 0 refills | Status: DC

## 2016-05-22 NOTE — Patient Instructions (Addendum)
Take ibuprofen 400-600 mg 3 times daily for pain and inflammation.  Doxycycline twice daily.  Return if worse.    IF you received an x-ray today, you will receive an invoice from [facility]. Please contact [facility] at [phone] with questions or concerns regarding your invoice.   IF you received labwork today, you will receive an invoice from [facility]. Please contact [facility] at [phone] with questions or concerns regarding your invoice.   Our billing staff will not be able to assist you with questions regarding bills from these companies.  You will be contacted with the lab results as soon as they are available. The fastest way to get your results is to activate your My Chart account. Instructions are located on the last page of this paperwork. If you have not heard from [place] regarding the results in 2 weeks, please contact this office.

## 2016-05-22 NOTE — Progress Notes (Signed)
Patient ID: Brooke Sullivan, female    DOB: 21-Jan-1951  Age: 66 y.o. MRN: [id number]  Chief Complaint  Patient presents with  . Fall    pain in left knee/ red and swelling/ x 1wk    Subjective:   66 year old lady who is here with a left knee hurting her. She had a fall about 3 weeks ago landing on her left knee. She developed a scab on the tip of the knee which has come off last night. She was able to express a little bit of drainage from it. She noticed a splotchy erythema of her left anterior thigh last night she feels a tight band sensation in it at times. It is continued to hurt her and she was concerned whether the infection present. She has had previous arthroscopic surgery a few years ago on that knee, and periodically has seen Dr. Vickki Hearing who is given her some injections. She started taking some doxy a few days ago, has a few left  Current allergies, medications, problem list, past/family and social histories reviewed.  Objective:  BP (!) 147/73   Pulse 81   Temp 98.2 F (36.8 C) (Oral)   Resp 16   Ht 5' 8.8" (1.748 m)   Wt 188 lb (85.3 kg)   SpO2 96%   BMI 27.92 kg/m   No major distress. Knee has a little hypertrophic deformity on its appearance with a little valgus deformity. There is a crusted 2 cm almost healed abrasion at the tip of the patella. The knee does not have any drainage from it. There is no effusion palpable. Good range of motion motion with mild crepitance. 5 does not feel significantly warm to touch. There is mild tenderness of the muscle. There is a little band of tightness on the medial anterior aspect of the quadriceps.  Assessment & Plan:   Assessment: 1. Fall, initial encounter   2. Contusion of left knee, initial encounter   3. Pain and swelling of left knee   4. Strain of left quadriceps muscle, initial encounter   5. Abrasion, knee, left, initial encounter       Plan: Knee pain secondary to abrasion and contusion from fall. I do not think  it is actively infected, but will check some labs.  Orders Placed This Encounter  Procedures  . DG Knee Complete 4 Views Left    Standing Status:   Future    Number of Occurrences:   1    Standing Expiration Date:   05/22/2017    Order Specific Question:   Reason for Exam (SYMPTOM  OR DIAGNOSIS REQUIRED)    Answer:   PAIN FROM FALL, CONTUSION    Order Specific Question:   Preferred imaging location?    Answer:   External  . POCT CBC    No orders of the defined types were placed in this encounter.  Results for orders placed or performed in visit on 05/22/16  POCT CBC  Result Value Ref Range   WBC 6.6 4.6 - 10.2 K/uL   Lymph, poc 1.6 0.6 - 3.4   POC LYMPH PERCENT 24.3 10 - 50 %L   MID (cbc) 0.5 0 - 0.9   POC MID % 7.8 0 - 12 %M   POC Granulocyte 4.5 2 - 6.9   Granulocyte percent 67.9 37 - 80 %G   RBC 4.68 4.04 - 5.48 M/uL   Hemoglobin 14.5 12.2 - 16.2 g/dL   HCT, POC 40.8 37.7 - 47.9 %  MCV 87.2 80 - 97 fL   MCH, POC 31.0 27 - 31.2 pg   MCHC 35.6 (A) 31.8 - 35.4 g/dL   RDW, POC 13.3 %   Platelet Count, POC 271 142 - 424 K/uL   MPV 6.9 0 - 99.8 fL   CLINICAL DATA:  Pain following fall  EXAM: LEFT KNEE - COMPLETE 4+ VIEW  COMPARISON:  None.  FINDINGS: Frontal, tunnel, lateral, and sunrise patellar images were obtained. There is soft tissue swelling medially. No fracture or dislocation is evident. There is no appreciable joint effusion. There is spurring in all compartments with mild to moderate joint space narrowing in the patellofemoral joint. Milder joint space narrowing is seen elsewhere. No erosive change.  IMPRESSION: Soft tissue swelling medially. No acute fracture or joint effusion. 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 No erosive change.      Patient Instructions   Take ibuprofen 400-600 mg 3 times daily for pain and inflammation.  Doxycycline twice daily.  Return if worse.    IF you received an x-ray today, you  will receive an invoice from [facility]. Please contact [facility] at [phone] with questions or concerns regarding your invoice.   IF you received labwork today, you will receive an invoice from [place]. Please contact [facility] at [phone] with questions or concerns regarding your invoice.   Our billing staff will not be able to assist you with questions regarding bills from these companies.  You will be contacted with the lab results as soon as they are available. The fastest way to get your results is to activate your My Chart account. Instructions are located on the last page of this paperwork. If you have not heard from [place] regarding the results in 2 weeks, please contact this office.         Return if symptoms worsen or fail to improve.   HOPPER,DAVID, MD 05/22/2016

## 2016-08-10 ENCOUNTER — Ambulatory Visit (INDEPENDENT_AMBULATORY_CARE_PROVIDER_SITE_OTHER): Payer: Medicare HMO | Admitting: Physician Assistant

## 2016-08-10 VITALS — BP 122/74 | HR 79 | Temp 98.3°F | Resp 16 | Ht 68.8 in | Wt 189.0 lb

## 2016-08-10 DIAGNOSIS — Z13220 Encounter for screening for lipoid disorders: Secondary | ICD-10-CM | POA: Diagnosis not present

## 2016-08-10 DIAGNOSIS — Z1389 Encounter for screening for other disorder: Secondary | ICD-10-CM | POA: Diagnosis not present

## 2016-08-10 DIAGNOSIS — Z23 Encounter for immunization: Secondary | ICD-10-CM

## 2016-08-10 DIAGNOSIS — Z1159 Encounter for screening for other viral diseases: Secondary | ICD-10-CM | POA: Diagnosis not present

## 2016-08-10 DIAGNOSIS — Z0000 Encounter for general adult medical examination without abnormal findings: Secondary | ICD-10-CM | POA: Diagnosis not present

## 2016-08-10 DIAGNOSIS — G4700 Insomnia, unspecified: Secondary | ICD-10-CM | POA: Diagnosis not present

## 2016-08-10 DIAGNOSIS — Z124 Encounter for screening for malignant neoplasm of cervix: Secondary | ICD-10-CM

## 2016-08-10 DIAGNOSIS — R002 Palpitations: Secondary | ICD-10-CM

## 2016-08-10 DIAGNOSIS — Z1231 Encounter for screening mammogram for malignant neoplasm of breast: Secondary | ICD-10-CM

## 2016-08-10 DIAGNOSIS — R0789 Other chest pain: Secondary | ICD-10-CM

## 2016-08-10 LAB — POCT URINALYSIS DIP (MANUAL ENTRY)
BILIRUBIN UA: NEGATIVE
GLUCOSE UA: NEGATIVE
Ketones, POC UA: NEGATIVE
Leukocytes, UA: NEGATIVE
NITRITE UA: NEGATIVE
Protein Ur, POC: NEGATIVE
RBC UA: NEGATIVE
Spec Grav, UA: 1.015 (ref 1.030–1.035)
UROBILINOGEN UA: 0.2 (ref ?–2.0)
pH, UA: 7 (ref 5.0–8.0)

## 2016-08-10 MED ORDER — ZOSTER VAC RECOMB ADJUVANTED 50 MCG/0.5ML IM SUSR: 0.5000 mL | Freq: Once | INTRAMUSCULAR | 1 refills | Status: AC

## 2016-08-10 MED ORDER — ZOLPIDEM TARTRATE 10 MG PO TABS: 10.0000 mg | ORAL_TABLET | Freq: Every evening | ORAL | 0 refills | Status: DC | PRN

## 2016-08-10 NOTE — Progress Notes (Signed)
Presents today for [employer] Visit-Subsequent.   Date of last exam: last visit with me 11/2015. Unclear if se has previously had a Medicare AWV.  Interpreter used for this visit? no  Patient Care Team: Harrison Mons, PA-C as PCP - General (Family Medicine)   Other items to address today:  1. Heart palpitations and chest/neck tightness. Occurs in episodes, lasting 5 minutes for the past several months. Associated with lightheadedness, "like when you realize that you've had a half glass of wine too much." Feels jittery on the inside, no visible tremor. Sometimes associated with headache. The palpitations are more likely to happen at night, and sometimes she gets up to take  Asa 81 mg. Episodes occur more frequently when she is stressed, aggravated, "pissed off."  2 previous stress tests, both normal. Father died of CHF at age 33.5 years. He was a smoker and alcoholic. She previously took amlodipine 2.5 mg, but stopped it some years ago when her husband was unemployed and they were cutting costs. BP has remained normal other than a visit recently for knee pain.  2. Stress/irritability. Her husband is unemployed again and having a difficult time finding new employment. She is supporting them financially, and they had to sell their beach house. She is frustrated, angry, and finds herself snapping at her husband more often. She is mad at him, but knows that it's not his fault.    Cancer Screening: Cervical: last pap 2011, normal. No history of abnormal pap test Breast: last mammogram 2005, normal Colon: last colonoscopy 10/2006, normal. Notes that she is not able to drink the liquid prep and needs the pills instead.  Prostate: n/a   Other Screening: Last screening for diabetes: 06/2013, glucose was 120, possibly non-fasting Last lipid screening: 08/08/2011, TC 240, TG 75, HDL 78, LDL 147   ADVANCE DIRECTIVES: Discussed: yes On File: no Materials Provided: not interested.  She has discussed her wishes thoroughly with her husband. Encouraged her to write it down.   Immunization status:   There is no immunization history on file for this patient. Will pull paper record for vaccine history. She believes she's had a tetanus in the past 10 years and a previous pneumococcal vaccine.  Health Maintenance Due  Topic Date Due  . Hepatitis C Screening  January 04, 1951  . TETANUS/TDAP  02/19/1968  . MAMMOGRAM  02/19/1999  . DEXA SCAN  02/18/2014  . PNA vac Low Risk Adult (1 of 2 - PCV13) 02/18/2014     Home Environment: Lives in a home with her husband.     Patient Active Problem List   Diagnosis Date Noted  . Insomnia 12/17/2015  . DYSPNEA 06/20/2010  . CHEST TIGHTNESS-PRESSURE-OTHER 06/20/2010  . 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 Arthritis    hands bilateral  . Chest pain, atypical   . Headache(784.0)   . Hypertension   . RLD (ruptured lumbar disc)   . Shortness of breath    with exertion     Past Surgical History:  Procedure Laterality Date  . KNEE ARTHROSCOPY  02/22/2012   Procedure: ARTHROSCOPY KNEE;  Surgeon: Augustin Schooling, MD;  Location: [place];  Service: Orthopedics;  Laterality: Left;  left knee arthroscopy  . SPINE SURGERY       Family History  Problem Relation Age of Onset  . Heart failure Father 73  . Alcohol abuse Father   . Depression Mother   .  Osteoporosis Mother      Social History   Social History  . Marital status: Married    Spouse name: Richardson Landry Annie Main)  . Number of children: N/A  . Years of education: N/A   Occupational History  . Nanny, housekeeper [employer]   Social History Main Topics  . Smoking status: Former Smoker    Packs/day: 1.00    Years: 20.00    Types: Cigarettes    Quit date: 09/19/1999  . Smokeless tobacco: Never Used  . Alcohol use 4.2 oz/week    7 Glasses of wine per week     Comment: socially  . Drug use: No  . Sexual activity:  Not on file   Other Topics Concern  . Not on file   Social History Narrative   Lives with her husband. Married since 1988.   Volunteers with animal rescue-fosters multiple dogs, cats.   Nanny.   House cleaning business.   Husband unemployed.     Allergies  Allergen Reactions  . Augmentin [Amoxicillin-Pot Clavulanate] Diarrhea and Nausea And Vomiting  . Codeine Nausea Only    Makes patient sleep for a long time and nauseated  . Naproxen Hives and Swelling  . Sulfonamide Derivatives Other (See Comments)    Yeast infection.     Prior to Admission medications   Medication Sig Start Date End Date Taking? Authorizing Provider  Coral Calcium 500 MG TABS Take 500 mg by mouth 2 (two) times daily.   Yes Historical Provider, MD  ibuprofen (ADVIL,MOTRIN) 200 MG tablet Take 400 mg by mouth every 6 (six) hours as needed.   Yes Historical Provider, MD  OVER THE COUNTER MEDICATION Take 1 tablet by mouth daily. [place] brand energy-booster.   Yes Historical Provider, MD  zolpidem (AMBIEN) 10 MG tablet Take 1 tablet (10 mg total) by mouth at bedtime as needed for sleep. Patient taking differently: Take 5 mg by mouth at bedtime as needed for sleep.  12/17/15  Yes Colette Dicamillo, PA-C  omeprazole (PRILOSEC) 20 MG capsule Take 20 mg by mouth daily as needed. For indigestion.    Historical Provider, MD     Depression screen [facility] 2/9 08/10/2016 05/22/2016 12/17/2015  Decreased Interest 0 0 0  Down, Depressed, Hopeless 0 0 0  PHQ - 2 Score 0 0 0     Fall Risk  08/10/2016 05/22/2016 12/17/2015  Falls in the past year? No No No     Functional Status Survey: Is the patient deaf or have difficulty hearing?: No Does the patient have difficulty seeing, even when wearing glasses/contacts?: No Does the patient have difficulty concentrating, remembering, or making decisions?: No Does the patient have difficulty walking or climbing stairs?: No Does the patient have difficulty dressing or bathing?: No Does the  patient have difficulty doing errands alone such as visiting a doctor's office or shopping?: No     Clinical Intake - 08/10/16 0830      Functional Status   Activities of Daily Living Independent   Ambulation Independent   Medication Administration Independent   Home Management Independent     Risk/Barriers   Barriers to Care Management & Learning None     Abuse/Neglect   Do you feel unsafe in your current relationship? No   Do you feel physically threatened by others? No   Anyone hurting you at home, work, or school? No   Unable to ask? No     Patient Literacy   How often do you need to have someone  help you when you read instructions, pamphlets, or other written materials from your doctor or pharmacy? 1 - Never     Investment banker, operational Needed? No        PHYSICAL EXAM: BP 122/74   Pulse 79   Temp 98.3 F (36.8 C) (Oral)   Resp 16   Ht 5' 8.8" (1.748 m)   Wt 189 lb (85.7 kg)   SpO2 98%   BMI 28.07 kg/m    Wt Readings from Last 3 Encounters:  08/10/16 189 lb (85.7 kg)  05/22/16 188 lb (85.3 kg)  12/17/15 185 lb 3.2 oz (84 kg)   BP Readings from Last 3 Encounters:  08/10/16 122/74  05/22/16 (!) 147/73  12/17/15 114/72     Visual Acuity Screening   Right eye Left eye Both eyes  Without correction:     With correction: 20/25 20/30 20/30     Physical Exam  Constitutional: She is oriented to person, place, and time. She appears well-developed and well-nourished. She is active and cooperative. No distress.  HENT:  Head: Normocephalic and atraumatic.  Right Ear: Hearing, tympanic membrane, external ear and ear canal normal.  Left Ear: Hearing, tympanic membrane, external ear and ear canal normal.  Nose: Nose normal.  Mouth/Throat: Uvula is midline, oropharynx is clear and moist and mucous membranes are normal. No oral lesions. No uvula swelling. No oropharyngeal exudate.  Eyes: Conjunctivae, EOM and lids are normal. Pupils are equal, round, and  reactive to light. Right eye exhibits no discharge. Left eye exhibits no discharge. No scleral icterus.  Fundoscopic exam:      The right eye shows no hemorrhage and no papilledema. The right eye shows red reflex.       The left eye shows no hemorrhage and no papilledema. The left eye shows red reflex.  Neck: Normal range of motion, full passive range of motion without pain and phonation normal. Neck supple. No thyromegaly present.  Cardiovascular: Normal rate, regular rhythm, normal heart sounds and intact distal pulses.  Exam reveals no gallop and no friction rub.   No murmur heard. Respiratory: Effort normal and breath sounds normal. Right breast exhibits no inverted nipple, no mass, no nipple discharge, no skin change and no tenderness. Left breast exhibits no inverted nipple, no mass, no nipple discharge, no skin change and no tenderness. Breasts are symmetrical.  GI: Soft. Normal appearance and bowel sounds are normal. There is no hepatosplenomegaly. There is no tenderness. Hernia confirmed negative in the right inguinal area and confirmed negative in the left inguinal area.  Genitourinary: Vagina normal and uterus normal. No breast swelling, tenderness, discharge or bleeding. Pelvic exam was performed with patient supine. No labial fusion. There is no rash, tenderness, lesion or injury on the right labia. There is no rash, tenderness, lesion or injury on the left labia. Cervix exhibits friability (minimal). Cervix exhibits no motion tenderness and no discharge. Right adnexum displays no mass, no tenderness and no fullness. Left adnexum displays no mass, no tenderness and no fullness.  Musculoskeletal:       Cervical back: Normal.       Thoracic back: Normal.       Lumbar back: Normal.  Lymphadenopathy:       Head (right side): No submandibular and no tonsillar adenopathy present.       Head (left side): No submandibular and no tonsillar adenopathy present.    She has no cervical adenopathy.         Right:  No inguinal and no supraclavicular adenopathy present.       Left: No inguinal and no supraclavicular adenopathy present.  Neurological: She is alert and oriented to person, place, and time. She has normal strength. No cranial nerve deficit or sensory deficit.  Skin: Skin is warm and dry. No rash noted. She is not diaphoretic.  Psychiatric: She has a normal mood and affect. Her speech is normal and behavior is normal. Judgment and thought content normal. Cognition and memory are normal.   EKG reviewed with Dr. Brigitte Pulse. NSR. No ischemic changes.    Education/Counseling: yes diet and exercise yes prevention of chronic diseases yes smoking/tobacco cessation yes review "Covered Medicare Preventive Services"    ASSESSMENT/PLAN: 1. Medicare annual wellness visit, subsequent Age appropriate anticipatory guidance provided.  2. Need for hepatitis C screening test - Hepatitis C antibody  3. Need for pneumococcal vaccination Will review record for previous Pneumovax dose. - Pneumococcal conjugate vaccine 13-valent IM  4. Screening for hyperlipidemia Excellent HDL last check. LDL elevated. Await labs. - Lipid panel  5. Screening for cervical cancer If cytology and HPV both negative, repeat co-testing in 5 years. - Pap IG and HPV (high risk) DNA detection  6. Encounter for screening mammogram for breast cancer - MM DIGITAL SCREENING BILATERAL; Future  7. Palpitations 8. CHEST TIGHTNESS-PRESSURE-OTHER Suspect this is stress related. Two previous normal stress tests. During the first one, she injured her LEFT knee, requiring surgery. The second was done chemically, and she related it was "horrible." She is very active, suspect that this is not representing CAD. A holter or event monitor may be helpful in assessing the episodes she experiences. She may benefit from a low-dose beta blocker to use when she has these episodes. Await cardiology recommendations. - CBC with  Differential/Platelet - Comprehensive metabolic panel - TSH - EKG 12-Lead - Ambulatory referral to Cardiology  9. Screening for blood or protein in urine - POCT urinalysis dipstick  10. Need for shingles vaccine - Zoster Vac Recomb Adjuvanted [facility]) injection; Inject 0.5 mLs into the muscle once.  Dispense: 1 each; Refill: 1  11. Insomnia, unspecified type Stable. Continue PRN zolpidem. - zolpidem (AMBIEN) 10 MG tablet; Take 1 tablet (10 mg total) by mouth at bedtime as needed for sleep.  Dispense: 30 tablet; Refill: 0   Return in about 6 months (around 02/10/2017). Will discuss colonoscopy again at that time.   Fara Chute, PA-C Physician Assistant-Certified [facility] at [place]

## 2016-08-10 NOTE — Patient Instructions (Addendum)
   IF you received an x-ray today, you will receive an invoice from [facility]. Please contact [facility] at [phone] with questions or concerns regarding your invoice.   IF you received labwork today, you will receive an invoice from [facility]. Please contact [facility] at [phone] with questions or concerns regarding your invoice.   Our billing staff will not be able to assist you with questions regarding bills from these companies.  You will be contacted with the lab results as soon as they are available. The fastest way to get your results is to activate your My Chart account. Instructions are located on the last page of this paperwork. If you have not heard from us regarding the results in 2 weeks, please contact this office.     Keeping You Healthy  Get These Tests  Blood Pressure- Have your blood pressure checked by your healthcare provider at least once a year.  Normal blood pressure is 120/80.  Weight- Have your body mass index (BMI) calculated to screen for obesity.  BMI is a measure of body fat based on height and weight.  You can calculate your own BMI at www.nhlbisupport.com/bmi/  Cholesterol- Have your cholesterol checked every year.  Diabetes- Have your blood sugar checked every year if you have high blood pressure, high cholesterol, a family history of diabetes or if you are overweight.  Pap Test - Have a pap test every 1 to 5 years if you have been sexually active.  If you are older than 65 and recent pap tests have been normal you may not need additional pap tests.  In addition, if you have had a hysterectomy  for benign disease additional pap tests are not necessary.  Mammogram-Yearly mammograms are essential for early detection of breast cancer  Screening for Colon Cancer- Colonoscopy starting at age 50. Screening may begin sooner depending on your family history and other health conditions.  Follow up colonoscopy as directed by your  Gastroenterologist.  Screening for Osteoporosis- Screening begins at age 65 with bone density scanning, sooner if you are at higher risk for developing Osteoporosis.  Get these medicines  Calcium with Vitamin D- Your body requires 1200-1500 mg of Calcium a day and 800-1000 IU of Vitamin D a day.  You can only absorb 500 mg of Calcium at a time therefore Calcium must be taken in 2 or 3 separate doses throughout the day.  Hormones- Hormone therapy has been associated with increased risk for certain cancers and heart disease.  Talk to your healthcare provider about if you need relief from menopausal symptoms.  Aspirin- Ask your healthcare provider about taking Aspirin to prevent Heart Disease and Stroke.  Get these Immuniztions  Flu shot- Every fall  Pneumonia shot- Once after the age of 65; if you are younger ask your healthcare provider if you need a pneumonia shot.  Tetanus- Every ten years.  Zostavax- Once after the age of 60 to prevent shingles.  Take these steps  Don't smoke- Your healthcare provider can help you quit. For tips on how to quit, ask your healthcare provider or go to www.smokefree.gov or call 1-800 QUIT-NOW.  Be physically active- Exercise 5 days a week for a minimum of 30 minutes.  If you are not already physically active, start slow and gradually work up to 30 minutes of moderate physical activity.  Try walking, dancing, bike riding, swimming, etc.  Eat a healthy diet- Eat a variety of healthy foods such as fruits, vegetables, whole grains, low   fat milk, low fat cheeses, yogurt, lean meats, chicken, fish, eggs, dried beans, tofu, etc.  For more information go to www.thenutritionsource.org  Dental visit- Brush and floss teeth twice daily; visit your dentist twice a year.  Eye exam- Visit your Optometrist or Ophthalmologist yearly.  Drink alcohol in moderation- Limit alcohol intake to one drink or less a day.  Never drink and drive.  Depression- Your emotional  health is as important as your physical health.  If you're feeling down or losing interest in things you normally enjoy, please talk to your healthcare provider.  Seat Belts- can save your life; always wear one  Smoke/Carbon Monoxide detectors- These detectors need to be installed on the appropriate level of your home.  Replace batteries at least once a year.  Violence- If anyone is threatening or hurting you, please tell your healthcare provider.  Living Will/ Health care power of attorney- Discuss with your healthcare provider and family.  

## 2016-08-11 LAB — COMPREHENSIVE METABOLIC PANEL
A/G RATIO: 1.8 (ref 1.2–2.2)
ALK PHOS: 101 IU/L (ref 39–117)
ALT: 25 IU/L (ref 0–32)
AST: 16 IU/L (ref 0–40)
Albumin: 4.2 g/dL (ref 3.6–4.8)
BUN / CREAT RATIO: 22 (ref 12–28)
BUN: 15 mg/dL (ref 8–27)
Bilirubin Total: 0.3 mg/dL (ref 0.0–1.2)
CO2: 25 mmol/L (ref 18–29)
Calcium: 9.6 mg/dL (ref 8.7–10.3)
Chloride: 100 mmol/L (ref 96–106)
Creatinine, Ser: 0.68 mg/dL (ref 0.57–1.00)
GFR calc Af Amer: 105 mL/min/{1.73_m2} (ref >59)
GFR calc non Af Amer: 91 mL/min/{1.73_m2} (ref >59)
GLOBULIN, TOTAL: 2.4 g/dL (ref 1.5–4.5)
Glucose: 95 mg/dL (ref 65–99)
POTASSIUM: 5 mmol/L (ref 3.5–5.2)
SODIUM: 138 mmol/L (ref 134–144)
Total Protein: 6.6 g/dL (ref 6.0–8.5)

## 2016-08-11 LAB — LIPID PANEL
CHOLESTEROL TOTAL: 220 mg/dL — AB (ref 100–199)
Chol/HDL Ratio: 2.8 ratio units (ref 0.0–4.4)
HDL: 78 mg/dL (ref >39)
LDL Calculated: 125 mg/dL — ABNORMAL HIGH (ref 0–99)
TRIGLYCERIDES: 84 mg/dL (ref 0–149)
VLDL Cholesterol Cal: 17 mg/dL (ref 5–40)

## 2016-08-11 LAB — CBC WITH DIFFERENTIAL/PLATELET
Basophils Absolute: 0 10*3/uL (ref 0.0–0.2)
Basos: 1 %
EOS (ABSOLUTE): 0.1 10*3/uL (ref 0.0–0.4)
EOS: 2 %
Hematocrit: 43.9 % (ref 34.0–46.6)
Hemoglobin: 14 g/dL (ref 11.1–15.9)
IMMATURE GRANS (ABS): 0 10*3/uL (ref 0.0–0.1)
IMMATURE GRANULOCYTES: 0 %
LYMPHS: 32 %
Lymphocytes Absolute: 1.2 10*3/uL (ref 0.7–3.1)
MCH: 28.9 pg (ref 26.6–33.0)
MCHC: 31.9 g/dL (ref 31.5–35.7)
MCV: 91 fL (ref 79–97)
MONOS ABS: 0.4 10*3/uL (ref 0.1–0.9)
Monocytes: 11 %
NEUTROS PCT: 54 %
Neutrophils Absolute: 2 10*3/uL (ref 1.4–7.0)
PLATELETS: 281 10*3/uL (ref 150–379)
RBC: 4.85 x10E6/uL (ref 3.77–5.28)
RDW: 13.9 % (ref 12.3–15.4)
WBC: 3.7 10*3/uL (ref 3.4–10.8)

## 2016-08-11 LAB — TSH: TSH: 2.05 u[IU]/mL (ref 0.450–4.500)

## 2016-08-11 LAB — HEPATITIS C ANTIBODY

## 2016-08-14 ENCOUNTER — Other Ambulatory Visit: Payer: Self-pay | Admitting: Physician Assistant

## 2016-08-14 DIAGNOSIS — G4700 Insomnia, unspecified: Secondary | ICD-10-CM

## 2016-08-14 LAB — PAP IG AND HPV HIGH-RISK
HPV, high-risk: NEGATIVE
PAP SMEAR COMMENT: 0

## 2016-08-17 NOTE — Telephone Encounter (Signed)
Pt calling stating that [facility] has tried to get in touch with [place] about pt Ambien has told pt to call us to ask for a refill on her Ambien please respond

## 2016-08-17 NOTE — Telephone Encounter (Signed)
Looks like it was filled 08/10/16 at ov, printed

## 2016-08-23 ENCOUNTER — Encounter: Payer: Self-pay | Admitting: Physician Assistant

## 2016-08-29 ENCOUNTER — Encounter: Payer: Self-pay | Admitting: Physician Assistant

## 2016-08-29 ENCOUNTER — Ambulatory Visit (INDEPENDENT_AMBULATORY_CARE_PROVIDER_SITE_OTHER): Payer: Medicare HMO | Admitting: Physician Assistant

## 2016-08-29 VITALS — BP 142/68 | HR 84 | Temp 97.7°F | Resp 16 | Ht 65.5 in | Wt 190.8 lb

## 2016-08-29 DIAGNOSIS — L299 Pruritus, unspecified: Secondary | ICD-10-CM

## 2016-08-29 DIAGNOSIS — B850 Pediculosis due to Pediculus humanus capitis: Secondary | ICD-10-CM | POA: Diagnosis not present

## 2016-08-29 DIAGNOSIS — G4700 Insomnia, unspecified: Secondary | ICD-10-CM | POA: Diagnosis not present

## 2016-08-29 MED ORDER — FLUOCINONIDE 0.05 % EX SOLN: 1.0000 "application " | Freq: Two times a day (BID) | CUTANEOUS | 0 refills | Status: DC

## 2016-08-29 MED ORDER — ZOLPIDEM TARTRATE 10 MG PO TABS: 10.0000 mg | ORAL_TABLET | Freq: Every evening | ORAL | 0 refills | Status: DC | PRN

## 2016-08-29 NOTE — Patient Instructions (Addendum)
Either get the [employer] back or follow the instructions on e-how for using Cetaphil cleansing solution.  Use OTC Claritin, Allegra or Zyrtec once each day. Take the Benadryl at bedtime if needed.    IF you received an x-ray today, you will receive an invoice from [facility]. Please contact [facility] at [phone] with questions or concerns regarding your invoice.   IF you received labwork today, you will receive an invoice from [place]. Please contact [facility] at [phone] with questions or concerns regarding your invoice.   Our billing staff will not be able to assist you with questions regarding bills from these companies.  You will be contacted with the lab results as soon as they are available. The fastest way to get your results is to activate your My Chart account. Instructions are located on the last page of this paperwork. If you have not heard from [place] regarding the results in 2 weeks, please contact this office.

## 2016-08-29 NOTE — Progress Notes (Signed)
Patient ID: Brooke Sullivan, female    DOB: May 19, 1951, 66 y.o.   MRN: [id number]  PCP: Harrison Mons, PA-C  Chief Complaint  Patient presents with  . Rash    on scalp for about a month    Subjective:   Presents for evaluation of a rash on her scalp.  Since late February. Two puppies with Lona Kettle, which isn't contagious to humans or other dogs, per her Animal nutritionist. Several hours after contact with one of them, her scalp started to itch. She can manage it during the day, when she's busy. But at night, it's worse.  Her hair dresser saw some red bumps. She can feel the bumps when she is scratching her scalp. No lice noted. She changed to a baby shampoo. Doesn't put any other products on her hair.  History of folliculitis, and had some clindamycin left over from a prescription 3 years ago. She took it as prescribed, for 5 days, without any improvement. Clindamycin caused nausea.  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  Notes that she received her first dose of Shingrix on 08/17/16 at her pharmacy. The following day she developed chills, body aches, itching and swelling of the arm at the site of the injection. Still has some itching and erythema of the arm. Felt like she had the flu.   Review of Systems As above.    Patient Active Problem List   Diagnosis Date Noted  . Insomnia 12/17/2015  . DYSPNEA 06/20/2010  . CHEST TIGHTNESS-PRESSURE-OTHER 06/20/2010  . 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 Authorizing Provider  Coral Calcium 500 MG TABS Take 500 mg by mouth 2 (two) times daily.   Yes Historical Provider, MD  ibuprofen (ADVIL,MOTRIN) 200 MG tablet Take 400 mg by mouth every 6 (six) hours as needed.   Yes Historical Provider, MD  omeprazole (PRILOSEC) 20 MG capsule Take 20 mg by mouth daily as needed. For indigestion.   Yes Historical  Provider, MD  OVER THE COUNTER MEDICATION Take 1 tablet by mouth daily. [place] brand energy-booster.   Yes Historical Provider, MD  zolpidem (AMBIEN) 10 MG tablet Take 1 tablet (10 mg total) by mouth at bedtime as needed for sleep. 08/10/16  Yes Harrison Mons, PA-C     Allergies  Allergen Reactions  . Augmentin [Amoxicillin-Pot Clavulanate] Diarrhea and Nausea And Vomiting  . Codeine Nausea Only    Makes patient sleep for a long time and nauseated  . Naproxen Hives and Swelling  . Sulfonamide Derivatives Other (See Comments)    Yeast infection.       Objective:  Physical Exam  Constitutional: She is oriented to person, place, and time. She appears well-developed and well-nourished. She is active and cooperative. No distress.  BP (!) 142/68 (BP Location: Right Arm, Patient Position: Sitting, Cuff Size: Large)   Pulse 84   Temp 97.7 F (36.5 C) (Oral)   Resp 16   Ht 5' 5.5" (1.664 m)   Wt 190 lb 12.8 oz (86.5 kg)   SpO2 95%   BMI 31.27 kg/m    Eyes: Conjunctivae are normal.  Pulmonary/Chest: Effort normal.  Neurological: She is alert and oriented to person, place, and time.  Psychiatric: She has a normal mood and affect. Her speech is normal and behavior is normal.    Scalp examined. No erythematous lesions. Very slightly raised lesions,  3-4 mm, normo-pigmented, identified by the patient by feel, are noted. Lice nits identified and confirmed by microscopy.       Assessment & Plan:   1. Pruritus of scalp - fluocinonide (LIDEX) 0.05 % external solution; Apply 1 application topically 2 (two) times daily.  Dispense: 60 mL; Refill: 0  2. Head lice OTC treatment options reviewed. She will select one and follow instructions.  3. Insomnia, unspecified type This prescription was printed at her last visit, but she did not receive it and it is not in the pick-up drawer, nor was it called/faxed to her pharmacy. Reprinted today. - zolpidem (AMBIEN) 10 MG tablet; Take 1 tablet (10 mg  total) by mouth at bedtime as needed for sleep.  Dispense: 30 tablet; Refill: 0    Return if symptoms worsen or fail to improve.   Fara Chute, PA-C [facility]

## 2016-08-31 DIAGNOSIS — D235 Other benign neoplasm of skin of trunk: Secondary | ICD-10-CM | POA: Diagnosis not present

## 2016-08-31 DIAGNOSIS — L578 Other skin changes due to chronic exposure to nonionizing radiation: Secondary | ICD-10-CM | POA: Diagnosis not present

## 2016-08-31 DIAGNOSIS — B850 Pediculosis due to Pediculus humanus capitis: Secondary | ICD-10-CM | POA: Diagnosis not present

## 2016-08-31 DIAGNOSIS — L821 Other seborrheic keratosis: Secondary | ICD-10-CM | POA: Diagnosis not present

## 2016-08-31 DIAGNOSIS — L259 Unspecified contact dermatitis, unspecified cause: Secondary | ICD-10-CM | POA: Diagnosis not present

## 2016-09-05 ENCOUNTER — Ambulatory Visit: Payer: Self-pay | Admitting: Cardiology

## 2016-09-10 ENCOUNTER — Ambulatory Visit
Admission: RE | Admit: 2016-09-10 | Discharge: 2016-09-10 | Disposition: A | Discharge status: Home / Self Care | Payer: Medicare HMO | Source: Ambulatory Visit | Attending: Physician Assistant | Admitting: Physician Assistant

## 2016-09-10 DIAGNOSIS — Z1231 Encounter for screening mammogram for malignant neoplasm of breast: Secondary | ICD-10-CM | POA: Diagnosis not present

## 2016-09-12 ENCOUNTER — Other Ambulatory Visit: Payer: Self-pay | Admitting: Physician Assistant

## 2016-09-12 DIAGNOSIS — R928 Other abnormal and inconclusive findings on diagnostic imaging of breast: Secondary | ICD-10-CM

## 2016-09-17 ENCOUNTER — Ambulatory Visit
Admission: RE | Admit: 2016-09-17 | Discharge: 2016-09-17 | Disposition: A | Discharge status: Home / Self Care | Payer: Medicare HMO | Source: Ambulatory Visit | Attending: Physician Assistant | Admitting: Physician Assistant

## 2016-09-17 ENCOUNTER — Other Ambulatory Visit: Payer: Self-pay | Admitting: Physician Assistant

## 2016-09-17 DIAGNOSIS — N63 Unspecified lump in unspecified breast: Secondary | ICD-10-CM

## 2016-09-17 DIAGNOSIS — R928 Other abnormal and inconclusive findings on diagnostic imaging of breast: Secondary | ICD-10-CM

## 2016-09-17 DIAGNOSIS — N6002 Solitary cyst of left breast: Secondary | ICD-10-CM | POA: Diagnosis not present

## 2016-09-17 DIAGNOSIS — N6001 Solitary cyst of right breast: Secondary | ICD-10-CM | POA: Diagnosis not present

## 2016-10-03 ENCOUNTER — Ambulatory Visit (INDEPENDENT_AMBULATORY_CARE_PROVIDER_SITE_OTHER): Payer: Medicare HMO

## 2016-10-03 ENCOUNTER — Ambulatory Visit (INDEPENDENT_AMBULATORY_CARE_PROVIDER_SITE_OTHER): Payer: Medicare HMO | Admitting: Cardiology

## 2016-10-03 ENCOUNTER — Encounter: Payer: Self-pay | Admitting: Cardiology

## 2016-10-03 VITALS — BP 150/76 | HR 65 | Ht 68.0 in | Wt 190.5 lb

## 2016-10-03 DIAGNOSIS — I10 Essential (primary) hypertension: Secondary | ICD-10-CM

## 2016-10-03 DIAGNOSIS — R002 Palpitations: Secondary | ICD-10-CM

## 2016-10-03 MED ORDER — AMLODIPINE BESYLATE 2.5 MG PO TABS: 2.5000 mg | ORAL_TABLET | Freq: Every day | ORAL | 3 refills | Status: DC

## 2016-10-03 NOTE — Progress Notes (Signed)
Cardiology Office Note   Date:  10/03/2016   ID:  Brooke Sullivan, DOB [birth year], MRN [id number]  Referring Doctor:  Harrison Mons, PA-C   Cardiologist:   Wende Bushy, MD   Reason for consultation:  Chief Complaint  Patient presents with  . other    New patient. Patient c/o feeling like her heart is pounding. Meds reviewed verbally with patient.       History of Present Illness: Brooke Sullivan is a 66 y.o. female who is being seen today for the evaluation of heart pounding and racing at the request of Harrison Mons, PA-C.  Pt reports feeling her heart pounding or racing. Going on for quite some time now. Usually at night. Not every night though. Symptoms are mainly in the chest. Moderate in intensity. Last for a few minutes at a time. No known precipitating or palliating factors, resolve spontaneously.  She usually eats dinner around 6 -7PM, goes to bed at around 9 or 9:30pm. She denies heartburn symptoms.  She recalls only one episode back in January where she walked from a very distant parking lot to the store and felt her heart pounding and shortness of breath and lightheadedness at that time. That eventually resolved. No recurrence since then. Otherwise, she does her usual chores without any shortness of breath or chest pain. No syncope.  In terms of hypertension, she had been on amlodipine 2.5 mg once a day previously. It was during the time that she was dealing with an issue with her neighbor having to sue her in [place], hence BP was higher. Her husband eventually  lost job and were not insured at the time. Patient decided to wean herself off of the amlodipine.   ROS:  Please see the history of present illness. Aside from mentioned under HPI, all other systems are reviewed and negative.     Past Medical History:  Diagnosis Date  . Arthritis    hands bilateral  . Chest pain, atypical   . Headache(784.0)   . Hypertension   . RLD (ruptured lumbar disc)   .  Shortness of breath    with exertion    Past Surgical History:  Procedure Laterality Date  . KNEE ARTHROSCOPY  02/22/2012   Procedure: ARTHROSCOPY KNEE;  Surgeon: Augustin Schooling, MD;  Location: [facility];  Service: Orthopedics;  Laterality: Left;  left knee arthroscopy  . SPINE SURGERY       reports that she quit smoking about 17 years ago. Her smoking use included Cigarettes. She has a 20.00 pack-year smoking history. She has never used smokeless tobacco. She reports that she drinks about 4.2 oz of alcohol per week . She reports that she does not use drugs.   family history includes Alcohol abuse in her father; Depression in her mother; Heart failure (age of onset: 91) in her father; Osteoporosis in her mother.   Outpatient Medications Prior to Visit  Medication Sig Dispense Refill  . ibuprofen (ADVIL,MOTRIN) 200 MG tablet Take 400 mg by mouth every 6 (six) hours as needed.    Marland Kitchen omeprazole (PRILOSEC) 20 MG capsule Take 20 mg by mouth daily as needed. For indigestion.    Marland Kitchen zolpidem (AMBIEN) 10 MG tablet Take 1 tablet (10 mg total) by mouth at bedtime as needed for sleep. 30 tablet 0  . Coral Calcium 500 MG TABS Take 500 mg by mouth 2 (two) times daily.    . fluocinonide (LIDEX) 0.05 % external solution Apply 1 application topically  2 (two) times daily. (Patient not taking: Reported on 10/03/2016) 60 mL 0  . OVER THE COUNTER MEDICATION Take 1 tablet by mouth daily. [place] brand energy-booster.     No facility-administered medications prior to visit.      Allergies: Augmentin [amoxicillin-pot clavulanate]; Codeine; Naproxen; and Sulfonamide derivatives    PHYSICAL EXAM: VS:  BP (!) 150/76 (BP Location: Right Arm, Patient Position: Sitting, Cuff Size: Normal)   Pulse 65   Ht 5\' 8"  (1.727 m)   Wt 190 lb 8 oz (86.4 kg)   BMI 28.97 kg/m  , Body mass index is 28.97 kg/m. Wt Readings from Last 3 Encounters:  10/03/16 190 lb 8 oz (86.4 kg)  08/29/16 190 lb 12.8 oz (86.5 kg)  08/10/16 189 lb  (85.7 kg)    GENERAL:  well developed, well nourished, not in acute distress HEENT: normocephalic, pink conjunctivae, anicteric sclerae, no xanthelasma, normal dentition, oropharynx clear NECK:  no neck vein engorgement, JVP normal, no hepatojugular reflux, carotid upstroke brisk and symmetric, no bruit, no thyromegaly, no lymphadenopathy LUNGS:  good respiratory effort, clear to auscultation bilaterally CV:  PMI not displaced, no thrills, no lifts, S1 and S2 within normal limits, no palpable S3 or S4, no murmurs, no rubs, no gallops ABD:  Soft, nontender, nondistended, normoactive bowel sounds, no abdominal aortic bruit, no hepatomegaly, no splenomegaly MS: nontender back, no kyphosis, no scoliosis, no joint deformities EXT:  2+ DP/PT pulses, no edema, no varicosities, no cyanosis, no clubbing SKIN: warm, nondiaphoretic, normal turgor, no ulcers NEUROPSYCH: alert, oriented to person, place, and time, sensory/motor grossly intact, normal mood, appropriate affect  Recent Labs: 05/22/2016: Hemoglobin 14.5 08/10/2016: ALT 25; BUN 15; Creatinine, Ser 0.68; Platelets 281; Potassium 5.0; Sodium 138; TSH 2.050   Lipid Panel    Component Value Date/Time   CHOL 220 (H) 08/10/2016 0910   TRIG 84 08/10/2016 0910   HDL 78 08/10/2016 0910   CHOLHDL 2.8 08/10/2016 0910   CHOLHDL 3.1 08/08/2011 1115   VLDL 15 08/08/2011 1115   LDLCALC 125 (H) 08/10/2016 0910     Other studies Reviewed:  EKG:  The ekg from 10/03/2016 was personally reviewed by me and it revealed sinus rhythm, 65 BPM.  Additional studies/ records that were reviewed personally reviewed by me today include:  Nuclear stress test from 07/21/2012: Probably normal, normal LV   ASSESSMENT AND PLAN: Palpitations Recommend long-term monitor, at least 2 weeks. Advised to minimize caffeine intake from [facility], also choose low-calorie powerade or Gatorade. Recommend to increase hydration with water.  Hypertension Recommend to  resume amlodipine 2.5 mg by mouth daily. Discussed importance of low sodium diet.   Current medicines are reviewed at length with the patient today.  The patient does not have concerns regarding medicines.  Labs/ tests ordered today include:  Orders Placed This Encounter  Procedures  . LONG TERM MONITOR (3-14 DAYS)  . EKG 12-Lead    I had a lengthy and detailed discussion with the patient regarding diagnoses, prognosis, diagnostic options, treatment options , and side effects of medications.   I counseled the patient on importance of lifestyle modification including heart healthy diet, regular physical activity.  Disposition:   FU with Cardiology after tests   Thank you for this consultation. We will forwarding this consultation to referring physician.   Signed, Wende Bushy, MD  10/03/2016 9:52 AM    [place]  This note was generated in part with voice recognition software and I apologize for any  typographical errors that were not detected and corrected.

## 2016-10-03 NOTE — Patient Instructions (Addendum)
Medication Instructions:  Your physician has recommended you make the following change in your medication:  1. START Amlodipine 2.5 mg once daily   Testing/Procedures: Your physician has recommended that you wear an Zio monitor. Zio monitors are medical devices that record the heart's electrical activity. Doctors most often [place] these monitors to diagnose arrhythmias. Arrhythmias are problems with the speed or rhythm of the heartbeat. The monitor is a small, portable device. You can wear one while you do your normal daily activities. This is usually used to diagnose what is causing palpitations/syncope (passing out).    Follow-Up: Your physician recommends that you schedule a follow-up appointment after monitor results.   It was a pleasure seeing you today here in the office. Please do not hesitate to give [place] a call back if you have any further questions. [place], BSN     Cardiac Event Monitoring A cardiac event monitor is a small recording device that is used to detect abnormal heart rhythms (arrhythmias). The monitor is used to record your heart rhythm when you have symptoms, such as:  Fast heartbeats (palpitations), such as heart racing or fluttering.  Dizziness.  Fainting or light-headedness.  Unexplained weakness. Some monitors are wired to electrodes placed on your chest. Electrodes are flat, sticky disks that attach to your skin. Other monitors may be hand-held or worn on the wrist. The monitor can be worn for up to 30 days. If the monitor is attached to your chest, a technician will prepare your chest for the electrode placement and show you how to work the monitor. Take time to practice using the monitor before you leave the office. Make sure you understand how to send the information from the monitor to your health care provider. In some cases, you may need to use a landline telephone instead of a cell phone. What are the risks? Generally, this device is  safe to use, but it possible that the skin under the electrodes will become irritated. How to use your cardiac event monitor  Wear your monitor at all times, except when you are in water:  Do not let the monitor get wet.  Take the monitor off when you bathe. Do not swim or use a hot tub with it on.  Keep your skin clean. Do not put body lotion or moisturizer on your chest.  Change the electrodes as told by your health care provider or any time they stop sticking to your skin. You may need to use medical tape to keep them on.  Try to put the electrodes in slightly different places on your chest to help prevent skin irritation. They must remain in the area under your left breast and in the upper right section of your chest.  Make sure the monitor is safely clipped to your clothing or in a location close to your body that your health care provider recommends.  Press the button to record as soon as you feel heart-related symptoms, such as:  Dizziness.  Weakness.  Light-headedness.  Palpitations.  Thumping or pounding in your chest.  Shortness of breath.  Unexplained weakness.  Keep a diary of your activities, such as walking, doing chores, and taking medicine. It is very important to note what you were doing when you pushed the button to record your symptoms. This will help your health care provider determine what might be contributing to your symptoms.  Send the recorded information as recommended by your health care provider. It may take some time  for your health care provider to process the results.  Change the batteries as told by your health care provider.  Keep electronic devices away from your monitor. This includes:  Tablets.  MP3 players.  Cell phones.  While wearing your monitor you should avoid:  Electric blankets.  Armed forces operational officer.  Electric toothbrushes.  Microwave ovens.  Magnets.  Metal detectors. Get help right away if:  You have chest  pain.  You have extreme difficulty breathing or shortness of breath.  You develop a very fast heartbeat that persists.  You develop dizziness that does not go away.  You faint or constantly feel like you are about to faint. Summary  A cardiac event monitor is a small recording device that is used to help detect abnormal heart rhythms (arrhythmias).  The monitor is used to record your heart rhythm when you have heart-related symptoms.  Make sure you understand how to send the information from the monitor to your health care provider.  It is important to press the button on the monitor when you have any heart-related symptoms.  Keep a diary of your activities, such as walking, doing chores, and taking medicine. It is very important to note what you were doing when you pushed the button to record your symptoms. This will help your health care provider learn what might be causing your symptoms. This information is not intended to replace advice given to you by your health care provider. Make sure you discuss any questions you have with your health care provider. Document Released: 02/14/2008 Document Revised: 04/21/2016 Document Reviewed: 04/21/2016 Elsevier Interactive Patient Education  2017 [employer].

## 2016-10-10 ENCOUNTER — Encounter: Payer: Self-pay | Admitting: Physician Assistant

## 2016-10-10 DIAGNOSIS — M8580 Other specified disorders of bone density and structure, unspecified site: Secondary | ICD-10-CM | POA: Insufficient documentation

## 2016-10-12 ENCOUNTER — Encounter: Payer: Self-pay | Admitting: Gastroenterology

## 2016-10-26 DIAGNOSIS — R002 Palpitations: Secondary | ICD-10-CM | POA: Diagnosis not present

## 2016-12-08 NOTE — Progress Notes (Signed)
Cardiology Office Note  Date:  12/10/2016   ID:  Brooke Sullivan, DOB 1951-01-14, MRN [id number]  PCP:  Harrison Mons, PA-C   Chief Complaint  Patient presents with  . other    F/u zio monitor. Meds reviewed verbally with pt.    HPI:  Brooke Sullivan is a 66 y.o. female with a history of Palpitations, tachycardia in the setting of stress Hypertension, previously on amlodipine  family stressors Who presents for follow-up of her tachycardia palpitations Smoked age 63 to late 37s Last seen in clinic May 2018  Self employed, cleaning service, dogs, nanny Back on amlodipine, BP stable  Two-week monitor was performed, 09/2016 Monitor showed normal sinus rhythm 3 short runs of SVT 6 beats Max heart rate 176 bpm Results discussed with her in detail  Prior stress test 07/2012  Currently not on a statin Total chol 220, LDL 125  Fm hx of CVA  EKG personally reviewed by myself on todays visit Shows normal sinus rhythm rate 83 bpm no significant ST or T-wave changes     PMH:   has a past medical history of Arthritis; Chest pain, atypical; Headache(784.0); Hypertension; RLD (ruptured lumbar disc); and Shortness of breath.  PSH:    Past Surgical History:  Procedure Laterality Date  . KNEE ARTHROSCOPY  02/22/2012   Procedure: ARTHROSCOPY KNEE;  Surgeon: Augustin Schooling, MD;  Location: [facility];  Service: Orthopedics;  Laterality: Left;  left knee arthroscopy  . SPINE SURGERY      Current Outpatient Prescriptions  Medication Sig Dispense Refill  . amLODipine (NORVASC) 2.5 MG tablet Take 1 tablet (2.5 mg total) by mouth daily. 30 tablet 3  . ibuprofen (ADVIL,MOTRIN) 200 MG tablet Take 400 mg by mouth every 6 (six) hours as needed.    Marland Kitchen omeprazole (PRILOSEC) 20 MG capsule Take 20 mg by mouth daily as needed. For indigestion.    Marland Kitchen zolpidem (AMBIEN) 10 MG tablet Take 1 tablet (10 mg total) by mouth at bedtime as needed for sleep. 30 tablet 0  . aspirin EC 81 MG tablet Take 1 tablet (81  mg total) by mouth daily. 90 tablet 3   No current facility-administered medications for this visit.      Allergies:   Augmentin [amoxicillin-pot clavulanate]; Codeine; Naproxen; and Sulfonamide derivatives   Social History:  The patient  reports that she quit smoking about 17 years ago. Her smoking use included Cigarettes. She has a 20.00 pack-year smoking history. She has never used smokeless tobacco. She reports that she drinks about 4.2 oz of alcohol per week . She reports that she does not use drugs.   Family History:   family history includes Alcohol abuse in her father; Depression in her mother; Heart failure (age of onset: 23) in her father; Osteoporosis in her mother.    Review of Systems: Review of Systems  Constitutional: Negative.   Respiratory: Negative.   Cardiovascular: Negative.   Gastrointestinal: Negative.   Musculoskeletal: Negative.   Neurological: Negative.   Psychiatric/Behavioral: Negative.   All other systems reviewed and are negative.    PHYSICAL EXAM: VS:  BP 128/70 (BP Location: Left Arm, Patient Position: Sitting, Cuff Size: Normal)   Pulse 83   Ht 5\' 8"  (1.727 m)   Wt 189 lb 8 oz (86 kg)   BMI 28.81 kg/m  , BMI Body mass index is 28.81 kg/m. GEN: Well nourished, well developed, in no acute distress  HEENT: normal  Neck: no JVD, carotid bruits, or masses  Cardiac: RRR; no murmurs, rubs, or gallops,no edema  Respiratory:  clear to auscultation bilaterally, normal work of breathing GI: soft, nontender, nondistended, + BS MS: no deformity or atrophy  Skin: warm and dry, no rash Neuro:  Strength and sensation are intact Psych: euthymic mood, full affect    Recent Labs: 08/10/2016: ALT 25; BUN 15; Creatinine, Ser 0.68; Hemoglobin 14.0; Platelets 281; Potassium 5.0; Sodium 138; TSH 2.050    Lipid Panel Lab Results  Component Value Date   CHOL 220 (H) 08/10/2016   HDL 78 08/10/2016   LDLCALC 125 (H) 08/10/2016   TRIG 84 08/10/2016       Wt Readings from Last 3 Encounters:  12/10/16 189 lb 8 oz (86 kg)  10/03/16 190 lb 8 oz (86.4 kg)  08/29/16 190 lb 12.8 oz (86.5 kg)       ASSESSMENT AND PLAN:  Paroxysmal tachycardia ([place]) - Plan: EKG 12-Lead Discussed previous circumstances for her tachycardia Likely stress-induced Unable to exclude run of atrial tachycardia or SVT in the setting of stress No significant arrhythmia captured on monitor If she has recurrent symptoms we could try propranolol or short acting diltiazem as needed  DYSPNEA - Plan: EKG 12-Lead Active at baseline, denies shortness of breath with exertion  Anxiety Previous history of significant stressors likely contributing to tachycardia Currently feels well Anxiety issues managed by primary care, may need benzodiazepines as needed but she is declining  CHEST TIGHTNESS-PRESSURE-OTHER  denies having any chest pain or pressure, active at baseline, no further workup needed   discussed CT coronary calcium scoring and carotid ultrasound for risk stratification   she will call us if she would like these ordered   Disposition:   F/U  As needed    Orders Placed This Encounter  Procedures  . EKG 12-Lead     Signed, Esmond Plants, M.D., Ph.D. 12/10/2016  [place], [place]

## 2016-12-10 ENCOUNTER — Ambulatory Visit (INDEPENDENT_AMBULATORY_CARE_PROVIDER_SITE_OTHER): Payer: Medicare HMO | Admitting: Cardiovascular Disease

## 2016-12-10 ENCOUNTER — Encounter: Payer: Self-pay | Admitting: Cardiovascular Disease

## 2016-12-10 VITALS — BP 128/70 | HR 83 | Ht 68.0 in | Wt 189.5 lb

## 2016-12-10 DIAGNOSIS — R0789 Other chest pain: Secondary | ICD-10-CM | POA: Diagnosis not present

## 2016-12-10 DIAGNOSIS — R69 Illness, unspecified: Secondary | ICD-10-CM | POA: Diagnosis not present

## 2016-12-10 DIAGNOSIS — I479 Paroxysmal tachycardia, unspecified: Secondary | ICD-10-CM

## 2016-12-10 DIAGNOSIS — F419 Anxiety disorder, unspecified: Secondary | ICD-10-CM

## 2016-12-10 DIAGNOSIS — R0602 Shortness of breath: Secondary | ICD-10-CM | POA: Diagnosis not present

## 2016-12-10 MED ORDER — ASPIRIN EC 81 MG PO TBEC: 81.0000 mg | DELAYED_RELEASE_TABLET | Freq: Every day | ORAL | 3 refills | Status: DC

## 2016-12-10 NOTE — Patient Instructions (Addendum)
Medication Instructions:   No medication changes made  Labwork:  No new labs needed  Testing/Procedures:  Research CT coronary calcium score $150   Follow-Up: It was a pleasure seeing you in the office today. Please call us if you have new issues that need to be addressed before your next appt.  [phone]  Your physician wants you to follow-up in: As needed   If you need a refill on your cardiac medications before your next appointment, please call your pharmacy.

## 2017-01-22 ENCOUNTER — Other Ambulatory Visit: Payer: Self-pay | Admitting: *Deleted

## 2017-01-22 MED ORDER — AMLODIPINE BESYLATE 2.5 MG PO TABS: 2.5000 mg | ORAL_TABLET | Freq: Every day | ORAL | 3 refills | Status: DC

## 2017-02-20 ENCOUNTER — Ambulatory Visit (INDEPENDENT_AMBULATORY_CARE_PROVIDER_SITE_OTHER): Payer: Medicare HMO | Admitting: Physician Assistant

## 2017-02-20 ENCOUNTER — Encounter: Payer: Self-pay | Admitting: Physician Assistant

## 2017-02-20 VITALS — BP 130/78 | HR 70 | Temp 97.6°F | Resp 16 | Ht 68.0 in | Wt 190.0 lb

## 2017-02-20 DIAGNOSIS — L989 Disorder of the skin and subcutaneous tissue, unspecified: Secondary | ICD-10-CM | POA: Diagnosis not present

## 2017-02-24 NOTE — Progress Notes (Signed)
PRIMARY CARE AT [facility] [address], [place] [address] [phone]  Date:  02/20/2017   Name:  Brooke Sullivan   DOB:  Aug 20, 1950   MRN:  [id number]  PCP:  Harrison Mons, PA-C    History of Present Illness:  Brooke Sullivan is a 66 y.o. female patient who presents to PCP with  Chief Complaint  Patient presents with  . Mass    red spot under right forearm, swollen friday     Patient notes that 4 days ago she noticed a darkened purple ring around a mole on her right inner arm.  It was not painful or pruritic.  She noted that it has faded back to normal.  The mole is same size it was.  She does not recall any trauma, or injury to the arm.  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     Patient Active Problem List   Diagnosis Date Noted  . Osteopenia 10/10/2016  . Insomnia 12/17/2015  . DYSPNEA 06/20/2010  . CHEST TIGHTNESS-PRESSURE-OTHER 06/20/2010  . 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 Arthritis    hands bilateral  . Chest pain, atypical   . Headache(784.0)   . Hypertension   . RLD (ruptured lumbar disc)   . Shortness of breath    with exertion    Past Surgical History:  Procedure Laterality Date  . KNEE ARTHROSCOPY  02/22/2012   Procedure: ARTHROSCOPY KNEE;  Surgeon: Augustin Schooling, MD;  Location: [facility];  Service: Orthopedics;  Laterality: Left;  left knee arthroscopy  . SPINE SURGERY      Social History  Substance Use Topics  . Smoking status: Former Smoker    Packs/day: 1.00    Years: 20.00    Types: Cigarettes    Quit date: 09/19/1999  . Smokeless tobacco: Never Used  . Alcohol use 4.2 oz/week    7 Glasses of wine per week     Comment: socially    Family History  Problem Relation Age of Onset  . Heart failure Father 34  . Alcohol abuse Father   . Depression Mother   . Osteoporosis Mother   . Breast cancer Neg Hx     Allergies  Allergen Reactions  . Augmentin  [Amoxicillin-Pot Clavulanate] Diarrhea and Nausea And Vomiting  . Codeine Nausea Only    Makes patient sleep for a long time and nauseated  . Naproxen Hives and Swelling  . Sulfonamide Derivatives Other (See Comments)    Yeast infection.    Medication list has been reviewed and updated.  Current Outpatient Prescriptions on File Prior to Visit  Medication Sig Dispense Refill  . amLODipine (NORVASC) 2.5 MG tablet Take 1 tablet (2.5 mg total) by mouth daily. 90 tablet 3  . aspirin EC 81 MG tablet Take 1 tablet (81 mg total) by mouth daily. 90 tablet 3  . ibuprofen (ADVIL,MOTRIN) 200 MG tablet Take 400 mg by mouth every 6 (six) hours as needed.    Marland Kitchen omeprazole (PRILOSEC) 20 MG capsule Take 20 mg by mouth daily as needed. For indigestion.    Marland Kitchen zolpidem (AMBIEN) 10 MG tablet Take 1 tablet (10 mg total) by mouth at bedtime as needed for sleep. 30 tablet 0   No current facility-administered medications on file prior to visit.     ROS ROS otherwise unremarkable unless listed above.  Physical Examination: BP 130/78   Pulse 70  Temp 97.6 F (36.4 C)   Resp 16   Ht 5\' 8"  (1.727 m)   Wt 190 lb (86.2 kg)   SpO2 95%   BMI 28.89 kg/m  Ideal Body Weight: Weight in (lb) to have BMI = 25: 164.1  Physical Exam  Constitutional: She is oriented to person, place, and time. She appears well-developed and well-nourished. No distress.  HENT:  Head: Normocephalic and atraumatic.  Right Ear: External ear normal.  Left Ear: External ear normal.  Eyes: Pupils are equal, round, and reactive to light. Conjunctivae and EOM are normal.  Cardiovascular: Normal rate.   Pulmonary/Chest: Effort normal. No respiratory distress.  Musculoskeletal:  Right upper arm with red .5cm mole that is consistent with a cherry angioma.  Yellowing ring is faint surrounding the lesion.     Neurological: She is alert and oriented to person, place, and time.  Skin: She is not diaphoretic.  Psychiatric: She has a normal  mood and affect. Her behavior is normal.     Assessment and Plan: Brooke Sullivan is a 66 y.o. female who is here today for cc of  Chief Complaint  Patient presents with  . Mass    red spot under right forearm, swollen friday  --this has resolved.   There are no diagnoses linked to this encount.his may have been bruised without her knowing.  Advised to watch.  If this reocurrs, obtain biopsy.   Skin lesion of right arm  Ivar Drape, PA-C [facility] 10/7/20189:48 AM

## 2017-03-01 ENCOUNTER — Encounter: Payer: Self-pay | Admitting: Physician Assistant

## 2017-03-01 ENCOUNTER — Ambulatory Visit (INDEPENDENT_AMBULATORY_CARE_PROVIDER_SITE_OTHER): Payer: Medicare HMO

## 2017-03-01 ENCOUNTER — Ambulatory Visit (INDEPENDENT_AMBULATORY_CARE_PROVIDER_SITE_OTHER): Payer: Medicare HMO | Admitting: Physician Assistant

## 2017-03-01 VITALS — BP 124/78 | HR 62 | Temp 98.2°F | Resp 18 | Ht 68.0 in | Wt 189.2 lb

## 2017-03-01 DIAGNOSIS — R5383 Other fatigue: Secondary | ICD-10-CM | POA: Diagnosis not present

## 2017-03-01 DIAGNOSIS — R5381 Other malaise: Secondary | ICD-10-CM

## 2017-03-01 DIAGNOSIS — M25562 Pain in left knee: Secondary | ICD-10-CM | POA: Diagnosis not present

## 2017-03-01 DIAGNOSIS — R0789 Other chest pain: Secondary | ICD-10-CM

## 2017-03-01 DIAGNOSIS — S8992XA Unspecified injury of left lower leg, initial encounter: Secondary | ICD-10-CM | POA: Diagnosis not present

## 2017-03-01 NOTE — Patient Instructions (Addendum)
I suspect that your symptoms are due to all the stressors that you are experiencing-physical manifestations of the emotional and intellectual burdens you carry.  Let's see what the lab results show. If they are normal, we should consider therapy and or medication to help.    IF you received an x-ray today, you will receive an invoice from [facility]. Please contact [facility] at [phone] with questions or concerns regarding your invoice.   IF you received labwork today, you will receive an invoice from [facility]. Please contact [facility] at [phone] with questions or concerns regarding your invoice.   Our billing staff will not be able to assist you with questions regarding bills from these companies.  You will be contacted with the lab results as soon as they are available. The fastest way to get your results is to activate your My Chart account. Instructions are located on the last page of this paperwork. If you have not heard from [place] regarding the results in 2 weeks, please contact this office.

## 2017-03-01 NOTE — Progress Notes (Signed)
Patient ID: Brooke Sullivan, female    DOB: 08-15-1950, 66 y.o.   MRN: [id number]  PCP: Harrison Mons, PA-C  Chief Complaint  Patient presents with  . Fall    x3 weeks ago, pt states she fell coming out of a door and fell on brick side walk and skinned up her left knee and pt states she overall doesn't feel good.    Subjective:   Presents for evaluation of LEFT knee pain and generalized feeling unwell.  Last week while leaving an office building, she became distracted and missed a step. She fell on the sidewalk and skinned her LEFT knee. She's been treating the skin injury at home with success, but is continuing to have pain in the joint.  In addition, she believes that there is something wrong. She feels generally unwell, and isn't really able to identify specific symptoms. She is irritable, tired, and just feels bad. Has some intermittent pressure in the chest.  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 Her husband was unemployed for a while and their finances were very tight. They sold their beach house, which really upset her, to help pay bills. Her husband's health has declined and he neglected it, ultimately having a stroke earlier this year. She is angry at him for jeopardizing their lives in this way. There is very little physical intimacy between them due to his erectile dysfunction, his lower apparent libido/interest, and her upset with him.   Declines flu vaccine. "I'm not sick."  Review of Systems As above. No CP, SOB, fever, chills. No nausea, vomiting, diarrhea. No visual changes, unexplained muscle/joint pain beyond the LEFT knee.     Patient Active Problem List   Diagnosis Date Noted  . Osteopenia 10/10/2016  . Insomnia 12/17/2015  . DYSPNEA 06/20/2010  . CHEST TIGHTNESS-PRESSURE-OTHER 06/20/2010  . 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 Authorizing Provider  amLODipine (NORVASC) 2.5 MG tablet Take 1 tablet (2.5 mg total) by mouth daily. 01/22/17 04/22/17 Yes Minna Merritts, MD  aspirin EC 81 MG tablet Take 1 tablet (81 mg total) by mouth daily. 12/10/16  Yes Gollan, Kathlene November, MD  ibuprofen (ADVIL,MOTRIN) 200 MG tablet Take 400 mg by mouth every 6 (six) hours as needed.   Yes [provider]  omeprazole (PRILOSEC) 20 MG capsule Take 20 mg by mouth daily as needed. For indigestion.   Yes [provider]  zolpidem (AMBIEN) 10 MG tablet Take 1 tablet (10 mg total) by mouth at bedtime as needed for sleep. 08/29/16  Yes Yolinda Duerr, PA-C  SHINGRIX injection TO BE ADMINISTERED BY PHARMACIST FOR IMMUNIZATION 01/11/17   [provider]     Allergies  Allergen Reactions  . Augmentin [Amoxicillin-Pot Clavulanate] Diarrhea and Nausea And Vomiting  . Codeine Nausea Only    Makes patient sleep for a long time and nauseated  . Naproxen Hives and Swelling  . Sulfonamide Derivatives Other (See Comments)    Yeast infection.       Objective:  Physical Exam  Constitutional: She is oriented to person, place, and time. She appears well-developed and well-nourished. She is active and cooperative. No distress.  BP 124/78 (BP Location: Left Arm, Patient Position: Sitting, Cuff Size: Normal)   Pulse 62   Temp 98.2 F (36.8 C) (Oral)   Resp 18   Ht 5\' 8"  (1.727 m)  Wt 189 lb 3.2 oz (85.8 kg)   SpO2 94%   BMI 28.77 kg/m   HENT:  Head: Normocephalic and atraumatic.  Right Ear: Hearing normal.  Left Ear: Hearing normal.  Eyes: Conjunctivae are normal. No scleral icterus.  Neck: Normal range of motion. Neck supple. No thyromegaly present.  Cardiovascular: Normal rate, regular rhythm and normal heart sounds.   Pulses:      Radial pulses are 2+ on the right side, and 2+ on the left side.  Pulmonary/Chest: Effort normal and breath sounds normal.  Musculoskeletal:       Left knee: Normal.        Legs: Lymphadenopathy:       Head (right side): No tonsillar, no preauricular, no posterior auricular and no occipital adenopathy present.       Head (left side): No tonsillar, no preauricular, no posterior auricular and no occipital adenopathy present.    She has no cervical adenopathy.       Right: No supraclavicular adenopathy present.       Left: No supraclavicular adenopathy present.  Neurological: She is alert and oriented to person, place, and time. No sensory deficit.  Skin: Skin is warm, dry and intact. No rash noted. No cyanosis or erythema. Nails show no clubbing.  Psychiatric: Her speech is normal and behavior is normal. Judgment and thought content normal. Her mood appears not anxious. Her affect is angry and labile. Her affect is not blunt and not inappropriate. Cognition and memory are normal. She exhibits a depressed mood.        Assessment & Plan:   Problem List Items Addressed This Visit    CHEST TIGHTNESS-PRESSURE-OTHER    She is followed by cardiology, Dr. Rockey Situ, and reports that her cardiac evaluation does not reveal a cardiac cause of this. I suspect it, along with the fatigue and malaise, is due to situational reactive mood disorder.        Other Visit Diagnoses    Acute pain of left knee    -  Primary   await radiographs.   Relevant Orders   DG Knee Complete 4 Views Left (Completed)   Malaise and fatigue       Relevant Orders   CBC with Differential/Platelet (Completed)   Comprehensive metabolic panel (Completed)   TSH (Completed)   T4, free (Completed)       Return for re-evaluation pending lab and radiographs.   Fara Chute, PA-C [facility] at [facility]

## 2017-03-03 LAB — CBC WITH DIFFERENTIAL/PLATELET
BASOS ABS: 0.1 10*3/uL (ref 0.0–0.2)
BASOS: 1 %
EOS (ABSOLUTE): 0.1 10*3/uL (ref 0.0–0.4)
Eos: 2 %
Hematocrit: 42.5 % (ref 34.0–46.6)
Hemoglobin: 14.3 g/dL (ref 11.1–15.9)
IMMATURE GRANS (ABS): 0 10*3/uL (ref 0.0–0.1)
Immature Granulocytes: 0 %
LYMPHS ABS: 1.7 10*3/uL (ref 0.7–3.1)
LYMPHS: 26 %
MCH: 29.8 pg (ref 26.6–33.0)
MCHC: 33.6 g/dL (ref 31.5–35.7)
MCV: 89 fL (ref 79–97)
Monocytes Absolute: 0.6 10*3/uL (ref 0.1–0.9)
Monocytes: 9 %
NEUTROS ABS: 4.1 10*3/uL (ref 1.4–7.0)
Neutrophils: 62 %
PLATELETS: 313 10*3/uL (ref 150–379)
RBC: 4.8 x10E6/uL (ref 3.77–5.28)
RDW: 13.5 % (ref 12.3–15.4)
WBC: 6.6 10*3/uL (ref 3.4–10.8)

## 2017-03-03 LAB — COMPREHENSIVE METABOLIC PANEL
A/G RATIO: 2 (ref 1.2–2.2)
ALK PHOS: 98 IU/L (ref 39–117)
ALT: 25 IU/L (ref 0–32)
AST: 20 IU/L (ref 0–40)
Albumin: 4.7 g/dL (ref 3.6–4.8)
BUN/Creatinine Ratio: 12 (ref 12–28)
BUN: 8 mg/dL (ref 8–27)
Bilirubin Total: 0.3 mg/dL (ref 0.0–1.2)
CO2: 23 mmol/L (ref 20–29)
CREATININE: 0.69 mg/dL (ref 0.57–1.00)
Calcium: 9.9 mg/dL (ref 8.7–10.3)
Chloride: 101 mmol/L (ref 96–106)
GFR calc Af Amer: 103 mL/min/{1.73_m2} (ref >59)
GFR calc non Af Amer: 90 mL/min/{1.73_m2} (ref >59)
GLOBULIN, TOTAL: 2.4 g/dL (ref 1.5–4.5)
GLUCOSE: 107 mg/dL — AB (ref 65–99)
POTASSIUM: 4.4 mmol/L (ref 3.5–5.2)
SODIUM: 139 mmol/L (ref 134–144)
Total Protein: 7.1 g/dL (ref 6.0–8.5)

## 2017-03-03 LAB — TSH: TSH: 1.5 u[IU]/mL (ref 0.450–4.500)

## 2017-03-03 LAB — T4, FREE: FREE T4: 1.33 ng/dL (ref 0.82–1.77)

## 2017-03-08 DIAGNOSIS — H5015 Alternating exotropia: Secondary | ICD-10-CM | POA: Diagnosis not present

## 2017-03-08 DIAGNOSIS — H524 Presbyopia: Secondary | ICD-10-CM | POA: Diagnosis not present

## 2017-03-08 DIAGNOSIS — Z9842 Cataract extraction status, left eye: Secondary | ICD-10-CM | POA: Diagnosis not present

## 2017-03-08 DIAGNOSIS — Z9841 Cataract extraction status, right eye: Secondary | ICD-10-CM | POA: Diagnosis not present

## 2017-03-09 NOTE — Assessment & Plan Note (Signed)
She is followed by cardiology, Dr. Rockey Situ, and reports that her cardiac evaluation does not reveal a cardiac cause of this. I suspect it, along with the fatigue and malaise, is due to situational reactive mood disorder.

## 2017-03-20 ENCOUNTER — Other Ambulatory Visit: Payer: Medicare HMO

## 2017-04-01 ENCOUNTER — Other Ambulatory Visit: Payer: Self-pay | Admitting: Physician Assistant

## 2017-04-01 DIAGNOSIS — G4700 Insomnia, unspecified: Secondary | ICD-10-CM

## 2017-04-01 NOTE — Telephone Encounter (Signed)
Controlled substance 

## 2017-04-03 ENCOUNTER — Telehealth: Payer: Self-pay

## 2017-04-03 NOTE — Telephone Encounter (Signed)
Meds ordered this encounter  Medications  . zolpidem (AMBIEN) 10 MG tablet    Sig: TAKE 1 TABLET BY MOUTH AT BEDTIME AS NEEDED FOR SLEEP    Dispense:  30 tablet    Refill:  0    This request is for a new prescription for a controlled substance as required by Federal/State law.

## 2017-05-01 NOTE — Telephone Encounter (Signed)
Error

## 2017-07-29 ENCOUNTER — Encounter: Payer: Self-pay | Admitting: Family Medicine

## 2017-07-29 ENCOUNTER — Ambulatory Visit (INDEPENDENT_AMBULATORY_CARE_PROVIDER_SITE_OTHER): Payer: Medicare HMO

## 2017-07-29 ENCOUNTER — Ambulatory Visit (INDEPENDENT_AMBULATORY_CARE_PROVIDER_SITE_OTHER): Payer: Medicare HMO | Admitting: Family Medicine

## 2017-07-29 ENCOUNTER — Other Ambulatory Visit: Payer: Self-pay

## 2017-07-29 VITALS — BP 128/60 | HR 82 | Temp 98.4°F | Resp 18 | Ht 68.0 in | Wt 191.6 lb

## 2017-07-29 DIAGNOSIS — M1712 Unilateral primary osteoarthritis, left knee: Secondary | ICD-10-CM | POA: Diagnosis not present

## 2017-07-29 DIAGNOSIS — M1711 Unilateral primary osteoarthritis, right knee: Secondary | ICD-10-CM | POA: Diagnosis not present

## 2017-07-29 DIAGNOSIS — M25562 Pain in left knee: Secondary | ICD-10-CM | POA: Diagnosis not present

## 2017-07-29 DIAGNOSIS — G8929 Other chronic pain: Secondary | ICD-10-CM

## 2017-07-29 DIAGNOSIS — M25561 Pain in right knee: Secondary | ICD-10-CM

## 2017-07-29 NOTE — Progress Notes (Signed)
Subjective:  By signing my name below, I, Moises Blood, attest that this documentation has been prepared under the direction and in the presence of Merri Ray, MD. Electronically Signed: Moises Blood, [place]. 07/29/2017 , 3:10 PM .  Patient was seen in Room 10 .   Patient ID: Brooke Sullivan, female    DOB: 1950-12-03, 67 y.o.   MRN: [id number] Chief Complaint  Patient presents with  . Knee Pain    needs shots in knees     HPI Brooke Sullivan is a 67 y.o. female Here for bilateral knee pain. Patient was last seen in Oct 2018 by her PCP, Harrison Mons. She had reported left knee pain at that time, apparently since a fall in Sept 2018. There were mild to moderate degenerative changes, stable but no joint effusion.   Left knee Patient reports having left knee pain for several years (about 7 years total). She's seen by Dr. Delilah Shan in the past. She informs having multiple injections in the left knee prior to left knee surgery. She received left knee surgery about 4-5 years ago for arthritis and bone spurs, done by Pennie Rushing at [facility]. However, her left knee has been hurting ever since surgery.   She states hurting her knee again after walking out of an office building and fell, hitting her left knee onto brick ground back in Nov 2018.   Right knee She notes right knee feeling unstable and gives way. She was on a road trip 2 days ago for her grandson's birthday. She was sitting in the car, and felt her right knee shifting. She describes knees being "stiff as a board" after sitting for a while. She hasn't received cortisone injections in her right knee. She notes right knee pain ongoing for a couple of months now.   She mentions knees start to throb after sitting on a stool for too long. She also notes hearing popping in her right knee after sitting for too long. She describes area of popping at the front of her knee. She's been applying BioFreeze over the area, and tried  using TENs unit over her right knee. She states having pain when climbing up stairs. She has knee braces at home, and wears them when she knows her job requires more stairs climbing.   She's been taking ibuprofen 200mg  about every 4 hours, about every other day. She believes she's taken Meloxicam in the past, but unsure. She also takes tart-cherry supplement. She reports unable to take naproxen or Aleve as it causes her to have hives and swelling, but is able to take ibuprofen without similar symptoms. She's taking Glucosamine in the past without any improvement. She mentions taking Ambien at night to help with sleep.   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 She lives in [place]. She works in [place] about twice a week.   Patient Active Problem List   Diagnosis Date Noted  . Osteopenia 10/10/2016  . Insomnia 12/17/2015  . DYSPNEA 06/20/2010  . CHEST TIGHTNESS-PRESSURE-OTHER 06/20/2010  . 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 Arthritis    hands bilateral  . Chest pain, atypical   . Headache(784.0)   . Hypertension   . RLD (ruptured lumbar disc)   . Shortness of breath    with exertion   Past Surgical History:  Procedure Laterality Date  . KNEE ARTHROSCOPY  02/22/2012  Procedure: ARTHROSCOPY KNEE;  Surgeon: Augustin Schooling, MD;  Location: [facility];  Service: Orthopedics;  Laterality: Left;  left knee arthroscopy  . SPINE SURGERY     Allergies  Allergen Reactions  . Augmentin [Amoxicillin-Pot Clavulanate] Diarrhea and Nausea And Vomiting  . Codeine Nausea Only    Makes patient sleep for a long time and nauseated  . Naproxen Hives and Swelling  . Sulfonamide Derivatives Other (See Comments)    Yeast infection.   Prior to Admission medications   Medication Sig Start Date End Date Taking? Authorizing Provider  aspirin EC 81 MG tablet Take 1 tablet (81 mg total) by mouth  daily. 12/10/16  Yes Gollan, Kathlene November, MD  ibuprofen (ADVIL,MOTRIN) 200 MG tablet Take 400 mg by mouth every 6 (six) hours as needed.   Yes [provider]  omeprazole (PRILOSEC) 20 MG capsule Take 20 mg by mouth daily as needed. For indigestion.   Yes [provider]  zolpidem (AMBIEN) 10 MG tablet TAKE 1 TABLET BY MOUTH AT BEDTIME AS NEEDED FOR SLEEP 04/03/17  Yes Jeffery, Chelle, PA-C  amLODipine (NORVASC) 2.5 MG tablet Take 1 tablet (2.5 mg total) by mouth daily. 01/22/17 04/22/17  Minna Merritts, MD  [facility] injection TO BE ADMINISTERED BY PHARMACIST FOR IMMUNIZATION 01/11/17   [provider]   Social History   Socioeconomic History  . Marital status: Married    Spouse name: Richardson Landry Annie Main)  . Number of children: Not on file  . Years of education: Not on file  . Highest education level: Not on file  Social Needs  . Financial resource strain: Not on file  . Food insecurity - worry: Not on file  . Food insecurity - inability: Not on file  . Transportation needs - medical: Not on file  . Transportation needs - non-medical: Not on file  Occupational History  . Occupation: Surveyor, minerals, housekeeper    Employer: [employer]  Tobacco Use  . Smoking status: Former Smoker    Packs/day: 1.00    Years: 20.00    Pack years: 20.00    Types: Cigarettes    Last attempt to quit: 09/19/1999    Years since quitting: 17.8  . Smokeless tobacco: Never Used  Substance and Sexual Activity  . Alcohol use: Yes    Alcohol/week: 4.2 oz    Types: 7 Glasses of wine per week    Comment: socially  . Drug use: No  . Sexual activity: Not on file  Other Topics Concern  . Not on file  Social History Narrative   Lives with her husband. Married since 1988.   Volunteers with animal rescue-fosters multiple dogs, cats.   Nanny.   House cleaning business.   Husband unemployed.   Review of Systems  Constitutional: Negative for chills, fatigue, fever and unexpected weight change.    Respiratory: Negative for cough.   Cardiovascular: Negative for leg swelling.  Gastrointestinal: Negative for constipation, diarrhea, nausea and vomiting.  Musculoskeletal: Positive for arthralgias and joint swelling. Negative for myalgias.  Skin: Negative for rash and wound.  Neurological: Negative for dizziness, weakness and headaches.       Objective:   Physical Exam  Constitutional: She is oriented to person, place, and time. She appears well-developed and well-nourished. No distress.  HENT:  Head: Normocephalic and atraumatic.  Eyes: EOM are normal. Pupils are equal, round, and reactive to light.  Neck: Neck supple.  Cardiovascular: Normal rate.  Pulmonary/Chest: Effort normal. No respiratory distress.  Musculoskeletal: Normal range  of motion.  Right knee: possible mild to moderate effusion on the right knee, full flexion and full extension, negative varus/valgus, negative mcmurray's, negative lachman; NVI distally; crepitance of right knee; she had some discomfort with patellar grind Left knee: no apparent effusion, negative mcmurray's, negative lachman, negative varus/valgus; NVI distally  Neurological: She is alert and oriented to person, place, and time.  Skin: Skin is warm and dry.  Psychiatric: She has a normal mood and affect. Her behavior is normal.  Nursing note and vitals reviewed.   Vitals:   07/29/17 1405  BP: 128/60  Pulse: 82  Resp: 18  Temp: 98.4 F (36.9 C)  TempSrc: Oral  SpO2: 94%  Weight: 191 lb 9.6 oz (86.9 kg)  Height: 5\' 8"  (1.727 m)   Dg Knee Complete 4 Views Right  Result Date: 07/29/2017 CLINICAL DATA:  Chronic pain of both knees.  No known injury. EXAM: RIGHT KNEE - COMPLETE 4+ VIEW COMPARISON:  None. FINDINGS: No evidence of fracture, dislocation, or joint effusion. Mild degenerative marginal spurring with patellofemoral compartment narrowing. IMPRESSION: 1. No acute finding. 2. Mild degenerative spurring with patellofemoral compartment  narrowing. Electronically Signed   By: Monte Fantasia M.D.   On: 07/29/2017 15:26       Assessment & Plan:    LUCELY LEARD is a 67 y.o. female Chronic pain of both knees - Plan: DG Knee Complete 4 Views Right, Ambulatory referral to Physical Therapy  Primary osteoarthritis of left knee - Plan: Ambulatory referral to Physical Therapy  Patellofemoral joint pain, right - Plan: Ambulatory referral to Physical Therapy  Chronic left knee pain since surgery, has some degenerative changes noted. For left knee suspect best option would be to meet with orthopedist to discuss various options including possible Visco supplementation versus repeat trial of corticosteroid injection. Can start with physical therapy - referral placed  Right knee pain appears to be primarily patellofemoral joint pain. Referred to physical therapist, held on injection at this time. Has braces at home that she would like to try before brace form here. handout given on knee pain as well as patellofemoral pain syndrome. Tylenol as needed, with occasional tramadol if needed for more severe pain. Has both at home. RTC precautions if worsening.  No orders of the defined types were placed in this encounter.  Patient Instructions   For left knee pain, I would like you to meet an orthopaedist to discuss options, but can also start with physical therapy.   Your right knee pain appears to be predominantly patellofemoral pain. See information below on this condition, but physical therapy may be the best initial approach. I will place that referral.   Tylenol arthritis for pain for pain.  Occasional tramadol if more severe pain.   Return to the clinic or go to the nearest emergency room if any of your symptoms worsen or new symptoms occur.   Knee Pain, Adult Knee pain in adults is common. It can be caused by many things, including:  Arthritis.  A fluid-filled sac (cyst) or growth in your knee.  An infection in your  knee.  An injury that will not heal.  Damage, swelling, or irritation of the tissues that support your knee.  Knee pain is usually not a sign of a serious problem. The pain may go away on its own with time and rest. If it does not, a health care provider may order tests to find the cause of the pain. These may include:  Imaging tests, such  as an X-ray, MRI, or ultrasound.  Joint aspiration. In this test, fluid is removed from the knee.  Arthroscopy. In this test, a lighted tube is inserted into knee and an image is projected onto a TV screen.  A biopsy. In this test, a sample of tissue is removed from the body and studied under a microscope.  Follow these instructions at home: Pay attention to any changes in your symptoms. Take these actions to relieve your pain. Activity  Rest your knee.  Do not do things that cause pain or make pain worse.  Avoid high-impact activities or exercises, such as running, jumping rope, or doing jumping jacks. General instructions  Take over-the-counter and prescription medicines only as told by your health care provider.  Raise (elevate) your knee above the level of your heart when you are sitting or lying down.  Sleep with a pillow under your knee.  If directed, apply ice to the knee: ? Put ice in a plastic bag. ? Place a towel between your skin and the bag. ? Leave the ice on for 20 minutes, 2-3 times a day.  Ask your health care provider if you should wear an elastic knee support.  Lose weight if you are overweight. Extra weight can put pressure on your knee.  Do not use any products that contain nicotine or tobacco, such as cigarettes and e-cigarettes. Smoking may slow the healing of any bone and joint problems that you may have. If you need help quitting, ask your health care provider. Contact a health care provider if:  Your knee pain continues, changes, or gets worse.  You have a fever along with knee pain.  Your knee buckles or  locks up.  Your knee swells, and the swelling becomes worse. Get help right away if:  Your knee feels warm to the touch.  You cannot move your knee.  You have severe pain in your knee.  You have chest pain.  You have trouble breathing. Summary  Knee pain in adults is common. It can be caused by many things, including, arthritis, infection, cysts, or injury.  Knee pain is usually not a sign of a serious problem, but if it does not go away, a health care provider may perform tests to know the cause of the pain.  Pay attention to any changes in your symptoms. Relieve your pain with rest, medicines, light activity, and use of ice.  Get help if your pain continues or becomes very severe, or if your knee buckles or locks up, or if you have chest pain or trouble breathing. This information is not intended to replace advice given to you by your health care provider. Make sure you discuss any questions you have with your health care provider. Document Released: 03/04/2007 Document Revised: 04/27/2016 Document Reviewed: 04/27/2016 Elsevier Interactive Patient Education  2018 [employer].    Patellofemoral Pain Syndrome Patellofemoral pain syndrome is a condition that involves a softening or breakdown of the tissue (cartilage) on the underside of your kneecap (patella). This causes pain in the front of the knee. The condition is also called runner's knee or chondromalacia patella. Patellofemoral pain syndrome is most common in young adults who are active in sports. Your knee is the largest joint in your body. The patella covers the front of your knee and is attached to muscles above and below your knee. The underside of the patella is covered with a smooth type of cartilage (synovium). The smooth surface helps the patella glide easily when  you move your knee. Patellofemoral pain syndrome causes swelling in the joint linings and bone surfaces in your knee. What are the causes? Patellofemoral  pain syndrome can be caused by:  Overuse.  Poor alignment of your knee joints.  Weak leg muscles.  A direct blow to your kneecap.  What increases the risk? You may be at risk for patellofemoral pain syndrome if you:  Do a lot of activities that can wear down your kneecap. These include: ? Running. ? Squatting. ? Climbing stairs.  Start a new physical activity or exercise program.  Wear shoes that do not fit well.  Do not have good leg strength.  Are overweight.  What are the signs or symptoms? Knee pain is the most common symptom of patellofemoral pain syndrome. This may feel like a dull, aching pain underneath your patella, in the front of your knee. There may be a popping or cracking sound when you move your knee. Pain may get worse with:  Exercise.  Climbing stairs.  Running.  Jumping.  Squatting.  Kneeling.  Sitting for a long time.  Moving or pushing on your patella.  How is this diagnosed? Your health care provider may be able to diagnose patellofemoral pain syndrome from your symptoms and medical history. You may be asked about your recent physical activities and which ones cause knee pain. Your health care provider may do a physical exam with certain tests to confirm the diagnosis. These may include:  Moving your patella back and forth.  Checking your range of knee motion.  Having you squat or jump to see if you have pain.  Checking the strength of your leg muscles.  An MRI of the knee may also be done. How is this treated? Patellofemoral pain syndrome can usually be treated at home with rest, ice, compression, and elevation (RICE). Other treatments may include:  Nonsteroidal anti-inflammatory drugs (NSAIDs).  Physical therapy to stretch and strengthen your leg muscles.  Shoe inserts (orthotics) to take stress off your knee.  A knee brace or knee support.  Surgery to remove damaged cartilage or move the patella to a better position. The need  for surgery is rare.  Follow these instructions at home:  Take medicines only as directed by your health care provider.  Rest your knee. ? When resting, keep your knee raised above the level of your heart. ? Avoid activities that cause knee pain.  Apply ice to the injured area: ? Put ice in a plastic bag. ? Place a towel between your skin and the bag. ? Leave the ice on for 20 minutes, 2-3 times a day.  Use splints, braces, knee supports, or walking aids as directed by your health care provider.  Perform stretching and strengthening exercises as directed by your health care provider or physical therapist.  Keep all follow-up visits as directed by your health care provider. This is important. Contact a health care provider if:  Your symptoms get worse.  You are not improving with home care. This information is not intended to replace advice given to you by your health care provider. Make sure you discuss any questions you have with your health care provider. Document Released: 04/25/2009 Document Revised: 10/13/2015 Document Reviewed: 07/27/2013 Elsevier Interactive Patient Education  2018 [employer].    IF you received an x-ray today, you will receive an invoice from [facility]. Please contact [facility] at [phone] with questions or concerns regarding your invoice.   IF you received labwork today, you will  receive an invoice from [facility]. Please contact [facility] at [phone] with questions or concerns regarding your invoice.   Our billing staff will not be able to assist you with questions regarding bills from these companies.  You will be contacted with the lab results as soon as they are available. The fastest way to get your results is to activate your My Chart account. Instructions are located on the last page of this paperwork. If you have not heard from [place] regarding the results in 2 weeks, please contact this office.      I personally  performed the services described in this documentation, which was scribed in my presence. The recorded information has been reviewed and considered for accuracy and completeness, addended by me as needed, and agree with information above.  Signed,   Merri Ray, MD [facility].  08/01/17 4:27 PM

## 2017-07-29 NOTE — Patient Instructions (Addendum)
For left knee pain, I would like you to meet an orthopaedist to discuss options, but can also start with physical therapy.   Your right knee pain appears to be predominantly patellofemoral pain. See information below on this condition, but physical therapy may be the best initial approach. I will place that referral.   Tylenol arthritis for pain for pain.  Occasional tramadol if more severe pain.   Return to the clinic or go to the nearest emergency room if any of your symptoms worsen or new symptoms occur.   Knee Pain, Adult Knee pain in adults is common. It can be caused by many things, including:  Arthritis.  A fluid-filled sac (cyst) or growth in your knee.  An infection in your knee.  An injury that will not heal.  Damage, swelling, or irritation of the tissues that support your knee.  Knee pain is usually not a sign of a serious problem. The pain may go away on its own with time and rest. If it does not, a health care provider may order tests to find the cause of the pain. These may include:  Imaging tests, such as an X-ray, MRI, or ultrasound.  Joint aspiration. In this test, fluid is removed from the knee.  Arthroscopy. In this test, a lighted tube is inserted into knee and an image is projected onto a TV screen.  A biopsy. In this test, a sample of tissue is removed from the body and studied under a microscope.  Follow these instructions at home: Pay attention to any changes in your symptoms. Take these actions to relieve your pain. Activity  Rest your knee.  Do not do things that cause pain or make pain worse.  Avoid high-impact activities or exercises, such as running, jumping rope, or doing jumping jacks. General instructions  Take over-the-counter and prescription medicines only as told by your health care provider.  Raise (elevate) your knee above the level of your heart when you are sitting or lying down.  Sleep with a pillow under your knee.  If  directed, apply ice to the knee: ? Put ice in a plastic bag. ? Place a towel between your skin and the bag. ? Leave the ice on for 20 minutes, 2-3 times a day.  Ask your health care provider if you should wear an elastic knee support.  Lose weight if you are overweight. Extra weight can put pressure on your knee.  Do not use any products that contain nicotine or tobacco, such as cigarettes and e-cigarettes. Smoking may slow the healing of any bone and joint problems that you may have. If you need help quitting, ask your health care provider. Contact a health care provider if:  Your knee pain continues, changes, or gets worse.  You have a fever along with knee pain.  Your knee buckles or locks up.  Your knee swells, and the swelling becomes worse. Get help right away if:  Your knee feels warm to the touch.  You cannot move your knee.  You have severe pain in your knee.  You have chest pain.  You have trouble breathing. Summary  Knee pain in adults is common. It can be caused by many things, including, arthritis, infection, cysts, or injury.  Knee pain is usually not a sign of a serious problem, but if it does not go away, a health care provider may perform tests to know the cause of the pain.  Pay attention to any changes in your symptoms. Relieve  your pain with rest, medicines, light activity, and use of ice.  Get help if your pain continues or becomes very severe, or if your knee buckles or locks up, or if you have chest pain or trouble breathing. This information is not intended to replace advice given to you by your health care provider. Make sure you discuss any questions you have with your health care provider. Document Released: 03/04/2007 Document Revised: 04/27/2016 Document Reviewed: 04/27/2016 Elsevier Interactive Patient Education  2018 [employer].    Patellofemoral Pain Syndrome Patellofemoral pain syndrome is a condition that involves a softening or  breakdown of the tissue (cartilage) on the underside of your kneecap (patella). This causes pain in the front of the knee. The condition is also called runner's knee or chondromalacia patella. Patellofemoral pain syndrome is most common in young adults who are active in sports. Your knee is the largest joint in your body. The patella covers the front of your knee and is attached to muscles above and below your knee. The underside of the patella is covered with a smooth type of cartilage (synovium). The smooth surface helps the patella glide easily when you move your knee. Patellofemoral pain syndrome causes swelling in the joint linings and bone surfaces in your knee. What are the causes? Patellofemoral pain syndrome can be caused by:  Overuse.  Poor alignment of your knee joints.  Weak leg muscles.  A direct blow to your kneecap.  What increases the risk? You may be at risk for patellofemoral pain syndrome if you:  Do a lot of activities that can wear down your kneecap. These include: ? Running. ? Squatting. ? Climbing stairs.  Start a new physical activity or exercise program.  Wear shoes that do not fit well.  Do not have good leg strength.  Are overweight.  What are the signs or symptoms? Knee pain is the most common symptom of patellofemoral pain syndrome. This may feel like a dull, aching pain underneath your patella, in the front of your knee. There may be a popping or cracking sound when you move your knee. Pain may get worse with:  Exercise.  Climbing stairs.  Running.  Jumping.  Squatting.  Kneeling.  Sitting for a long time.  Moving or pushing on your patella.  How is this diagnosed? Your health care provider may be able to diagnose patellofemoral pain syndrome from your symptoms and medical history. You may be asked about your recent physical activities and which ones cause knee pain. Your health care provider may do a physical exam with certain tests to  confirm the diagnosis. These may include:  Moving your patella back and forth.  Checking your range of knee motion.  Having you squat or jump to see if you have pain.  Checking the strength of your leg muscles.  An MRI of the knee may also be done. How is this treated? Patellofemoral pain syndrome can usually be treated at home with rest, ice, compression, and elevation (RICE). Other treatments may include:  Nonsteroidal anti-inflammatory drugs (NSAIDs).  Physical therapy to stretch and strengthen your leg muscles.  Shoe inserts (orthotics) to take stress off your knee.  A knee brace or knee support.  Surgery to remove damaged cartilage or move the patella to a better position. The need for surgery is rare.  Follow these instructions at home:  Take medicines only as directed by your health care provider.  Rest your knee. ? When resting, keep your knee raised above the level  of your heart. ? Avoid activities that cause knee pain.  Apply ice to the injured area: ? Put ice in a plastic bag. ? Place a towel between your skin and the bag. ? Leave the ice on for 20 minutes, 2-3 times a day.  Use splints, braces, knee supports, or walking aids as directed by your health care provider.  Perform stretching and strengthening exercises as directed by your health care provider or physical therapist.  Keep all follow-up visits as directed by your health care provider. This is important. Contact a health care provider if:  Your symptoms get worse.  You are not improving with home care. This information is not intended to replace advice given to you by your health care provider. Make sure you discuss any questions you have with your health care provider. Document Released: 04/25/2009 Document Revised: 10/13/2015 Document Reviewed: 07/27/2013 Elsevier Interactive Patient Education  2018 [employer].    IF you received an x-ray today, you will receive an invoice from [facility]  Radiology. Please contact [facility] at [phone] with questions or concerns regarding your invoice.   IF you received labwork today, you will receive an invoice from [facility]. Please contact [facility] at [phone] with questions or concerns regarding your invoice.   Our billing staff will not be able to assist you with questions regarding bills from these companies.  You will be contacted with the lab results as soon as they are available. The fastest way to get your results is to activate your My Chart account. Instructions are located on the last page of this paperwork. If you have not heard from [place] regarding the results in 2 weeks, please contact this office.

## 2017-08-21 ENCOUNTER — Encounter: Payer: Self-pay | Admitting: Physician Assistant

## 2017-08-26 ENCOUNTER — Encounter: Payer: Self-pay | Admitting: Physician Assistant

## 2017-08-30 DIAGNOSIS — L814 Other melanin hyperpigmentation: Secondary | ICD-10-CM | POA: Diagnosis not present

## 2017-08-30 DIAGNOSIS — D1801 Hemangioma of skin and subcutaneous tissue: Secondary | ICD-10-CM | POA: Diagnosis not present

## 2017-08-30 DIAGNOSIS — D225 Melanocytic nevi of trunk: Secondary | ICD-10-CM | POA: Diagnosis not present

## 2017-09-18 DIAGNOSIS — M7672 Peroneal tendinitis, left leg: Secondary | ICD-10-CM | POA: Diagnosis not present

## 2017-09-18 DIAGNOSIS — M84375A Stress fracture, left foot, initial encounter for fracture: Secondary | ICD-10-CM | POA: Diagnosis not present

## 2017-09-23 ENCOUNTER — Encounter: Payer: Self-pay | Admitting: Physician Assistant

## 2017-09-23 ENCOUNTER — Ambulatory Visit (INDEPENDENT_AMBULATORY_CARE_PROVIDER_SITE_OTHER): Payer: Medicare HMO | Admitting: Physician Assistant

## 2017-09-23 ENCOUNTER — Encounter: Payer: Self-pay | Admitting: Gastroenterology

## 2017-09-23 ENCOUNTER — Other Ambulatory Visit: Payer: Self-pay

## 2017-09-23 VITALS — BP 116/72 | HR 80 | Temp 98.0°F | Resp 16 | Ht 68.0 in

## 2017-09-23 DIAGNOSIS — R195 Other fecal abnormalities: Secondary | ICD-10-CM | POA: Diagnosis not present

## 2017-09-23 DIAGNOSIS — R739 Hyperglycemia, unspecified: Secondary | ICD-10-CM

## 2017-09-23 DIAGNOSIS — G4700 Insomnia, unspecified: Secondary | ICD-10-CM

## 2017-09-23 DIAGNOSIS — Z13220 Encounter for screening for lipoid disorders: Secondary | ICD-10-CM

## 2017-09-23 DIAGNOSIS — R110 Nausea: Secondary | ICD-10-CM

## 2017-09-23 DIAGNOSIS — I10 Essential (primary) hypertension: Secondary | ICD-10-CM

## 2017-09-23 LAB — LIPID PANEL
CHOL/HDL RATIO: 2.9 ratio (ref 0.0–4.4)
Cholesterol, Total: 185 mg/dL (ref 100–199)
HDL: 64 mg/dL (ref >39)
LDL Calculated: 107 mg/dL — ABNORMAL HIGH (ref 0–99)
Triglycerides: 72 mg/dL (ref 0–149)
VLDL Cholesterol Cal: 14 mg/dL (ref 5–40)

## 2017-09-23 LAB — COMPREHENSIVE METABOLIC PANEL
ALBUMIN: 4.1 g/dL (ref 3.6–4.8)
ALT: 59 IU/L — AB (ref 0–32)
AST: 32 IU/L (ref 0–40)
Albumin/Globulin Ratio: 2 (ref 1.2–2.2)
Alkaline Phosphatase: 141 IU/L — ABNORMAL HIGH (ref 39–117)
BILIRUBIN TOTAL: 0.2 mg/dL (ref 0.0–1.2)
BUN / CREAT RATIO: 16 (ref 12–28)
BUN: 10 mg/dL (ref 8–27)
CALCIUM: 9.6 mg/dL (ref 8.7–10.3)
CHLORIDE: 102 mmol/L (ref 96–106)
CO2: 27 mmol/L (ref 20–29)
Creatinine, Ser: 0.61 mg/dL (ref 0.57–1.00)
GFR, EST AFRICAN AMERICAN: 108 mL/min/{1.73_m2} (ref >59)
GFR, EST NON AFRICAN AMERICAN: 93 mL/min/{1.73_m2} (ref >59)
Globulin, Total: 2.1 g/dL (ref 1.5–4.5)
Glucose: 90 mg/dL (ref 65–99)
Potassium: 5.1 mmol/L (ref 3.5–5.2)
Sodium: 142 mmol/L (ref 134–144)
TOTAL PROTEIN: 6.2 g/dL (ref 6.0–8.5)

## 2017-09-23 LAB — HEMOGLOBIN A1C
Est. average glucose Bld gHb Est-mCnc: 117 mg/dL
Hgb A1c MFr Bld: 5.7 % — ABNORMAL HIGH (ref 4.8–5.6)

## 2017-09-23 MED ORDER — AMLODIPINE BESYLATE 2.5 MG PO TABS: 2.5000 mg | ORAL_TABLET | Freq: Every day | ORAL | 3 refills | Status: DC

## 2017-09-23 MED ORDER — ZOLPIDEM TARTRATE 10 MG PO TABS: 10.0000 mg | ORAL_TABLET | Freq: Every evening | ORAL | 0 refills | Status: AC | PRN

## 2017-09-23 NOTE — Patient Instructions (Addendum)
Take ranitidine 150 mg twice daily to help with the morning nausea.    IF you received an x-ray today, you will receive an invoice from [facility]. Please contact [facility] at [phone] with questions or concerns regarding your invoice.   IF you received labwork today, you will receive an invoice from [place]. Please contact [facility] at [phone] with questions or concerns regarding your invoice.   Our billing staff will not be able to assist you with questions regarding bills from these companies.  You will be contacted with the lab results as soon as they are available. The fastest way to get your results is to activate your My Chart account. Instructions are located on the last page of this paperwork. If you have not heard from [place] regarding the results in 2 weeks, please contact this office.

## 2017-09-23 NOTE — Progress Notes (Signed)
Patient ID: Brooke Sullivan, female    DOB: 1950/11/03, 67 y.o.   MRN: [id number]  PCP: Brooke Mons, PA-C  Chief Complaint  Patient presents with  . Follow-up    labs and radiographs     Subjective:   Presents for evaluation of blood pressure, cholesterol, insomnia, also 1 month of nausea and abdominal pain.  Last labs were in October. Last lipids 07/2016. Needs refills of amlodipine and zolpidem. They are working well. No adverse effects.  CAM walker. Lateral foot pain. Noted slight 5th metatarsal fracture at a visit with Dr. Gershon Mussel. Improving. Is following up with Dr. Delilah Shan for bilateral knee OA. Has seen dermatology.  About 1 month of morning nausea. Associated with episodic "twingey pain" in the LLQ. Sometimes pain in the LLQ with taking a deep breath. Also has episodes of subjective fever and chills, but with normal temperature. Historically has experienced constipation, but has developed urgency of stool following meals (within about 15 minutes) and some leakage of stool associated with burning sensation at the rectum. This is a particularly significant issue in that she drives to work in [place] 3 days/week. Thinks that she has developed lactose intolerance, so uses Almond Milk.  Review of Systems As above.    Patient Active Problem List   Diagnosis Date Noted  . Osteopenia 10/10/2016  . Insomnia 12/17/2015  . DYSPNEA 06/20/2010  . CHEST TIGHTNESS-PRESSURE-OTHER 06/20/2010  . 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 Authorizing Provider  aspirin EC 81 MG tablet Take 1 tablet (81 mg total) by mouth daily. 12/10/16  Yes Gollan, Kathlene November, MD  ibuprofen (ADVIL,MOTRIN) 200 MG tablet Take 400 mg by mouth every 6 (six) hours as needed.   Yes [provider]  omeprazole (PRILOSEC) 20 MG capsule Take 20 mg by mouth daily as needed. For indigestion.   Yes [provider]    zolpidem (AMBIEN) 10 MG tablet TAKE 1 TABLET BY MOUTH AT BEDTIME AS NEEDED FOR SLEEP 04/03/17  Yes Vlad Mayberry, PA-C  amLODipine (NORVASC) 2.5 MG tablet Take 1 tablet (2.5 mg total) by mouth daily. 01/22/17 04/22/17  Minna Merritts, MD     Allergies  Allergen Reactions  . Augmentin [Amoxicillin-Pot Clavulanate] Diarrhea and Nausea And Vomiting  . Codeine Nausea Only    Makes patient sleep for a long time and nauseated  . Naproxen Hives and Swelling  . Sulfonamide Derivatives Other (See Comments)    Yeast infection.       Objective:  Physical Exam  Constitutional: She is oriented to person, place, and time. She appears well-developed and well-nourished. She is active and cooperative. No distress.  BP 116/72   Pulse 80   Temp 98 F (36.7 C)   Resp 16   Ht 5\' 8"  (1.727 m)   SpO2 94%   BMI 29.13 kg/m   HENT:  Head: Normocephalic and atraumatic.  Right Ear: Hearing normal.  Left Ear: Hearing normal.  Eyes: Conjunctivae are normal. No scleral icterus.  Neck: Normal range of motion. Neck supple. No thyromegaly present.  Cardiovascular: Normal rate, regular rhythm and normal heart sounds.  Pulses:      Radial pulses are 2+ on the right side, and 2+ on the left side.  Pulmonary/Chest: Effort normal and breath sounds normal.  Abdominal: Soft. Bowel sounds are normal. She exhibits no distension and no mass. There is no tenderness. There is no rebound  and no guarding. No hernia.  Lymphadenopathy:       Head (right side): No tonsillar, no preauricular, no posterior auricular and no occipital adenopathy present.       Head (left side): No tonsillar, no preauricular, no posterior auricular and no occipital adenopathy present.    She has no cervical adenopathy.       Right: No supraclavicular adenopathy present.       Left: No supraclavicular adenopathy present.  Neurological: She is alert and oriented to person, place, and time. No sensory deficit.  Skin: Skin is warm, dry and  intact. No rash noted. No cyanosis or erythema. Nails show no clubbing.  Psychiatric: She has a normal mood and affect. Her speech is normal and behavior is normal.           Assessment & Plan:   Problem List Items Addressed This Visit    Insomnia    Chronic.  Stable.  Continue zolpidem.      Relevant Medications   zolpidem (AMBIEN) 10 MG tablet   Change in stool    Recommend evaluation with gastroenterology.  This may be chronic constipation, with liquid stool losing around but she is due for evaluation and colorectal cancer screening as well.      Relevant Orders   Ambulatory referral to Gastroenterology   Benign essential HTN    Well-controlled.  Continue amlodipine 2.5 mg daily.      Relevant Medications   amLODipine (NORVASC) 2.5 MG tablet    Other Visit Diagnoses    Nausea without vomiting    -  Primary   Suspect reflux.  Recommend evaluation with gastroenterology given her  recent stool changes.   Relevant Orders   Ambulatory referral to Gastroenterology   Screening for hyperlipidemia       Relevant Orders   Lipid panel (Completed)   Hyperglycemia       Relevant Orders   Comprehensive metabolic panel (Completed)   Hemoglobin A1c (Completed)       No follow-ups on file.   Fara Chute, PA-C [facility] [place]

## 2017-09-25 DIAGNOSIS — M84375D Stress fracture, left foot, subsequent encounter for fracture with routine healing: Secondary | ICD-10-CM | POA: Diagnosis not present

## 2017-09-30 DIAGNOSIS — I10 Essential (primary) hypertension: Secondary | ICD-10-CM | POA: Insufficient documentation

## 2017-09-30 NOTE — Assessment & Plan Note (Signed)
Well controlled. Continue amlodipine 2.5 mg daily.  

## 2017-09-30 NOTE — Assessment & Plan Note (Signed)
Recommend evaluation with gastroenterology.  This may be chronic constipation, with liquid stool losing around but she is due for evaluation and colorectal cancer screening as well.

## 2017-09-30 NOTE — Assessment & Plan Note (Signed)
Chronic.  Stable.  Continue zolpidem.

## 2017-10-03 ENCOUNTER — Telehealth: Payer: Self-pay | Admitting: Physician Assistant

## 2017-10-03 NOTE — Telephone Encounter (Signed)
Cholesterol is MUCH improved. A1C is mildly elevated, reflecting "prediabetes," so healthy eating and continued active lifestyle are super important. Kidney and liver tests are normal except one of the liver tests (Alkaline phosphatase), which is mildly elevated, and will likely normalize upon recheck, which I recommend in 3 months.

## 2017-10-03 NOTE — Telephone Encounter (Signed)
Forwarded PEC message re: lab results - (not released) to [employer]

## 2017-10-03 NOTE — Telephone Encounter (Signed)
Phone call to patient, relayed message from [place]. Patient verbalizes understading. States she has always had normal lab results. Is coming in for appointment tomorrow to discuss upper right quadrant "shocking pain", heartburn, and Zantac not helping symptoms. Reports frequent and urgent bowel movements and some loose stools. States she cancelled appointment at GI for scope due to being unable to tolerate liquid medications- states she vomits when she takes liquid medications.

## 2017-10-03 NOTE — Telephone Encounter (Unsigned)
Copied from [place] [id number]. Topic: Quick Communication - Lab Results >> Oct 03, 2017  9:27 AM Percell Belt A wrote: Pt called in and would like someone to call her with the may lab results.

## 2017-10-04 ENCOUNTER — Other Ambulatory Visit: Payer: Self-pay

## 2017-10-04 ENCOUNTER — Encounter: Payer: Self-pay | Admitting: Physician Assistant

## 2017-10-04 ENCOUNTER — Ambulatory Visit (INDEPENDENT_AMBULATORY_CARE_PROVIDER_SITE_OTHER): Payer: Medicare HMO | Admitting: Physician Assistant

## 2017-10-04 ENCOUNTER — Ambulatory Visit (INDEPENDENT_AMBULATORY_CARE_PROVIDER_SITE_OTHER): Payer: Medicare HMO

## 2017-10-04 VITALS — BP 126/68 | HR 84 | Temp 97.9°F | Resp 16 | Ht 68.0 in | Wt 183.8 lb

## 2017-10-04 DIAGNOSIS — R110 Nausea: Secondary | ICD-10-CM | POA: Diagnosis not present

## 2017-10-04 DIAGNOSIS — R195 Other fecal abnormalities: Secondary | ICD-10-CM

## 2017-10-04 DIAGNOSIS — R109 Unspecified abdominal pain: Secondary | ICD-10-CM | POA: Diagnosis not present

## 2017-10-04 DIAGNOSIS — R1013 Epigastric pain: Secondary | ICD-10-CM | POA: Diagnosis not present

## 2017-10-04 DIAGNOSIS — R1310 Dysphagia, unspecified: Secondary | ICD-10-CM | POA: Insufficient documentation

## 2017-10-04 MED ORDER — RABEPRAZOLE SODIUM 20 MG PO TBEC: 20.0000 mg | DELAYED_RELEASE_TABLET | Freq: Every day | ORAL | 3 refills | Status: DC

## 2017-10-04 NOTE — Patient Instructions (Signed)
     IF you received an x-ray today, you will receive an invoice from [facility]. Please contact [facility] at [phone] with questions or concerns regarding your invoice.   IF you received labwork today, you will receive an invoice from [facility]. Please contact [facility] at [phone] with questions or concerns regarding your invoice.   Our billing staff will not be able to assist you with questions regarding bills from these companies.  You will be contacted with the lab results as soon as they are available. The fastest way to get your results is to activate your My Chart account. Instructions are located on the last page of this paperwork. If you have not heard from us regarding the results in 2 weeks, please contact this office.     

## 2017-10-04 NOTE — Progress Notes (Signed)
Patient ID: Brooke Sullivan, female    DOB: May 26, 1950, 67 y.o.   MRN: [id number]  PCP: Harrison Mons, PA-C  Chief Complaint  Patient presents with  . Abdominal Pain    still having nausea     Subjective:   Presents for evaluation of abdominal pain..  Recall that at her visit on 5//2019, we suspected reflux as cause..  No benefit with ranitidine.  Has switched to omeprazole. Did get a call to schedule with GI, but "I cannot drink the liquid." Recalls using Aciphex for similar symptoms previously. Continues to experience urgency of stool following meals and some leakage. All her friends have told her it's her gallbladder. RUQ "electrical jolts" at random. Can be on the RIGHT or LEFT, extends down into the vaginal area, feeling like "everything is falling out."  For the past 2 days, has eaten only pears and cereal. 'Hungry, but I don't have an appetite.' Feels gassy, but not increased flatulence or belching. Feels "acidy" in the upper abdomen.  Continues to have episodes of feeling flushed, chilled and extreme fatigue (wraps up in a blanket and goes to sleep for several hours).   Review of Systems As above.  No chest pain, shortness of breath, blurred vision, dizziness. No urinary symptoms.   No new muscle or joint pain.    Patient Active Problem List   Diagnosis Date Noted  . Benign essential HTN 09/30/2017  . Change in stool 09/23/2017  . Osteopenia 10/10/2016  . Insomnia 12/17/2015  . DYSPNEA 06/20/2010  . CHEST TIGHTNESS-PRESSURE-OTHER 06/20/2010  . 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 Authorizing Provider  amLODipine (NORVASC) 2.5 MG tablet Take 1 tablet (2.5 mg total) by mouth daily. 09/23/17 12/22/17 Yes Baker Kogler, PA-C  aspirin EC 81 MG tablet Take 1 tablet (81 mg total) by mouth daily. 12/10/16  Yes Gollan, Kathlene November, MD  ibuprofen (ADVIL,MOTRIN) 200 MG tablet Take 400  mg by mouth every 6 (six) hours as needed.   Yes [provider]  omeprazole (PRILOSEC) 20 MG capsule Take 20 mg by mouth daily as needed. For indigestion.   Yes [provider]  zolpidem (AMBIEN) 10 MG tablet Take 1 tablet (10 mg total) by mouth at bedtime as needed. for sleep 09/23/17  Yes Jacqulynn Cadet, Moe Brier, PA-C     Allergies  Allergen Reactions  . Augmentin [Amoxicillin-Pot Clavulanate] Diarrhea and Nausea And Vomiting  . Codeine Nausea Only    Makes patient sleep for a long time and nauseated  . Naproxen Hives and Swelling  . Sulfonamide Derivatives Other (See Comments)    Yeast infection.       Objective:  Physical Exam  Constitutional: She is oriented to person, place, and time. She appears well-developed and well-nourished. She is active and cooperative. No distress.  BP 126/68   Pulse 84   Temp 97.9 F (36.6 C)   Resp 16   Ht 5\' 8"  (1.727 m)   Wt 183 lb 12.8 oz (83.4 kg)   SpO2 96%   BMI 27.95 kg/m   HENT:  Head: Normocephalic and atraumatic.  Right Ear: Hearing normal.  Left Ear: Hearing normal.  Eyes: Conjunctivae are normal. No scleral icterus.  Neck: Normal range of motion. Neck supple. No thyromegaly present.  Cardiovascular: Normal rate, regular rhythm and normal heart sounds.  Pulses:      Radial pulses are 2+ on the right side, and  2+ on the left side.  Pulmonary/Chest: Effort normal and breath sounds normal.  Abdominal: Bowel sounds are normal. She exhibits no shifting dullness, no distension, no pulsatile liver, no fluid wave, no abdominal bruit, no ascites, no pulsatile midline mass and no mass. There is no hepatosplenomegaly, splenomegaly or hepatomegaly. There is tenderness in the right upper quadrant and epigastric area. There is no rigidity, no rebound, no guarding, no CVA tenderness, no tenderness at McBurney's point and negative Murphy's sign. No hernia.  Lymphadenopathy:       Head (right side): No tonsillar, no preauricular, no  posterior auricular and no occipital adenopathy present.       Head (left side): No tonsillar, no preauricular, no posterior auricular and no occipital adenopathy present.    She has no cervical adenopathy.       Right: No supraclavicular adenopathy present.       Left: No supraclavicular adenopathy present.  Neurological: She is alert and oriented to person, place, and time. No sensory deficit.  Skin: Skin is warm, dry and intact. No rash noted. No cyanosis or erythema. Nails show no clubbing.  Psychiatric: She has a normal mood and affect. Her speech is normal and behavior is normal.    Wt Readings from Last 3 Encounters:  10/04/17 183 lb 12.8 oz (83.4 kg)  07/29/17 191 lb 9.6 oz (86.9 kg)  03/01/17 189 lb 3.2 oz (85.8 kg)      Assessment & Plan:   Problem List Items Addressed This Visit    Change in stool    Abdominal radiographs.  Explore options for alternate colonoscopy prep that uses pills rather than liquid.      Relevant Orders   DG Abd Acute W/Chest (Completed)    Other Visit Diagnoses    Nausea without vomiting    -  Primary   Switch from omeprazole to AcipHex, previously helped similar symptoms.   Relevant Orders   US Abdomen Limited RUQ   Abdominal pain, epigastric       Epigastric/right upper quadrant.  Gallbladder ultrasound.   Relevant Medications   RABEprazole (ACIPHEX) 20 MG tablet   Other Relevant Orders   US Abdomen Limited RUQ   DG Abd Acute W/Chest (Completed)       Return if symptoms worsen or fail to improve.   Fara Chute, PA-C [facility] at [place]

## 2017-10-05 NOTE — Assessment & Plan Note (Signed)
Abdominal radiographs.  Explore options for alternate colonoscopy prep that uses pills rather than liquid.

## 2017-10-09 DIAGNOSIS — M84375D Stress fracture, left foot, subsequent encounter for fracture with routine healing: Secondary | ICD-10-CM | POA: Diagnosis not present

## 2017-10-15 ENCOUNTER — Telehealth: Payer: Self-pay

## 2017-10-15 NOTE — Telephone Encounter (Signed)
Patient will come in on 10/30/17 3:00 to discuss.

## 2017-10-15 NOTE — Telephone Encounter (Signed)
-----   Message from Gatha Mayer, MD sent at 10/15/2017 12:45 PM EDT ----- Regarding: needs appt Can you get this lady in - an APP is ok but can use a June banding slot that is open also  Having sxs and drives to [facility] 3 d a week for work so want to see if we can get her in sooner than next available  If she sees an app let me know whom and when  [facility]  CE[facility] ----- Message ----- From: Harrison Mons, PA-C Sent: 10/05/2017   4:16 PM To: Gatha Mayer, MD Subject: RE: colonoscopy prep                           Thank you, Glendell Docker!  Warmly, Chelle ----- Message ----- From: Gatha Mayer, MD Sent: 10/05/2017   3:01 PM To: Harrison Mons, PA-C Subject: RE: colonoscopy prep                           There is a pill prep called Osmo prep - it still requires drinking of quantities of water.  There is a very low volume liquid prep that might be better.  Front staff/RN staff does not always know these things.  We will contact her and encourage her to come in to discuss her options in person  Best regards,  Glendell Docker ----- Message ----- From: Harrison Mons, PA-C Sent: 10/05/2017   1:49 PM To: Gatha Mayer, MD Subject: colonoscopy prep                               Hi Glendell Docker,  I have a patient who needs GI evaluation for nausea, vomiting and urgency of stool with some leakage.  She is also overdue for colonoscopy she has a long put off due to the prep.  She has an aversion to liquid medications and preparations (though she drink liquids without any difficulty for hydration) that stems from forced castor oil for constipation treatment in childhood.  I referred her for evaluation and she was scheduled but canceled when she was told that none of your team uses a non-liquid prep for colonoscopy.  If that is in fact the case, are you aware of any colonoscopy prep that can be performed with tablets/capsules only?  Warmly, Chelle

## 2017-10-30 ENCOUNTER — Ambulatory Visit: Payer: Medicare HMO | Admitting: Internal Medicine

## 2017-10-30 ENCOUNTER — Encounter: Payer: Self-pay | Admitting: Internal Medicine

## 2017-10-30 VITALS — BP 124/82 | HR 80 | Ht 68.0 in | Wt 188.0 lb

## 2017-10-30 DIAGNOSIS — R1310 Dysphagia, unspecified: Secondary | ICD-10-CM

## 2017-10-30 DIAGNOSIS — Z1211 Encounter for screening for malignant neoplasm of colon: Secondary | ICD-10-CM

## 2017-10-30 MED ORDER — SOD PHOS MONO-SOD PHOS DIBASIC 1.102-0.398 G PO TABS: ORAL_TABLET | ORAL | 0 refills | Status: DC

## 2017-10-30 NOTE — Progress Notes (Signed)
   Brooke Sullivan Char 67 y.o. 1950-10-05 [id number]  Assessment & Plan:   Encounter Diagnoses  Name Primary?  . Colon cancer screening Yes  . Difficulty swallowing liquids     Osmoprep will be used to prep for screening colonoscopy. The risks and benefits as well as alternatives of endoscopic procedure(s) have been discussed and reviewed. All questions answered. The patient agrees to proceed.  She is aware of rare risk of kidney toxicity with Osmoprep - she also declined stool-based screening test   Subjective:   Chief Complaint: colon cancer screening  HPI 67 yo ww here to discuss colon cancer screening and wants pill prep for colonoscopy. She has rare lower abdominal pains and urgent loose stools.  No sig change in habits overall.  She does not tolerate drinking large quantities of liquids well in partcular thicker liquids even small quantities.  Negative colonoscopy 2008 - Visicol prep used  Allergies  Allergen Reactions  . Augmentin [Amoxicillin-Pot Clavulanate] Diarrhea and Nausea And Vomiting  . Codeine Nausea Only    Makes patient sleep for a long time and nauseated  . Naproxen Hives and Swelling  . Sulfonamide Derivatives Other (See Comments)    Yeast infection.   Current Meds  Medication Sig  . amLODipine (NORVASC) 2.5 MG tablet Take 1 tablet (2.5 mg total) by mouth daily.  Marland Kitchen aspirin EC 81 MG tablet Take 1 tablet (81 mg total) by mouth daily.  Marland Kitchen ibuprofen (ADVIL,MOTRIN) 200 MG tablet Take 400 mg by mouth every 6 (six) hours as needed.  . RABEprazole (ACIPHEX) 20 MG tablet Take 1 tablet (20 mg total) by mouth daily.  Marland Kitchen zolpidem (AMBIEN) 10 MG tablet Take 1 tablet (10 mg total) by mouth at bedtime as needed. for sleep   Past Medical History:  Diagnosis Date  . Arthritis    hands bilateral  . Chest pain, atypical   . Headache(784.0)   . Hypertension   . RLD (ruptured lumbar disc)   . Shortness of breath    with exertion   Past Surgical History:    Procedure Laterality Date  . COLONOSCOPY    . KNEE ARTHROSCOPY  02/22/2012   Procedure: ARTHROSCOPY KNEE;  Surgeon: Augustin Schooling, MD;  Location: [facility];  Service: Orthopedics;  Laterality: Left;  left knee arthroscopy  . SPINE SURGERY     Social History   Social History Narrative   Lives with her husband. Married since 1988.   Volunteers with animal rescue-fosters multiple dogs, cats.   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   House cleaning business.   Husband unemployed.   + EtOH no tobacco/drugd   family history includes Alcohol abuse in her father; Depression in her mother; Heart failure (age of onset: 38) in her father; Osteoporosis in her mother.   Review of Systems   Objective:   Physical Exam @BP  124/82   Pulse 80   Ht 5\' 8"  (1.727 m)   Wt 188 lb (85.3 kg)   SpO2 98%   BMI 28.59 kg/m @  General:  NAD Eyes:   anicteric Lungs:  clear Heart::  S1S2 no rubs, murmurs or gallops Abdomen:  soft and nontender, BS+ Ext:   no edema, cyanosis or clubbing    Data Reviewed:  PCP notes, l;abs in EMR

## 2017-10-30 NOTE — Patient Instructions (Signed)
  You have been scheduled for a colonoscopy. Please follow written instructions given to you at your visit today.  Please pick up your prep supplies at the pharmacy within the next 1-3 days. If you use inhalers (even only as needed), please bring them with you on the day of your procedure.  We will put you on the cancellation list as well for an earlier Friday AM.    We have sent the following medications to your pharmacy for you to pick up at your convenience: osmoprep    I appreciate the opportunity to care for you. Silvano Rusk, MD, [facility]

## 2017-11-02 ENCOUNTER — Encounter: Payer: Self-pay | Admitting: Internal Medicine

## 2017-11-05 ENCOUNTER — Telehealth: Payer: Self-pay | Admitting: Physician Assistant

## 2017-11-05 NOTE — Telephone Encounter (Signed)
Please see note below. 

## 2017-11-05 NOTE — Telephone Encounter (Signed)
Copied from [place] [phone] [id number]. Topic: General - Other >> Nov 05, 2017  1:29 PM Carolyn Stare wrote:  Peter Congo with RN with [facility] call to say since pt had a foot fracture that they are recommending a Bone Density  [address]

## 2017-11-07 NOTE — Telephone Encounter (Signed)
Please call and let the patient know this and see if she will schedule an appt to discuss.

## 2017-11-11 NOTE — Telephone Encounter (Signed)
Noted  

## 2017-11-11 NOTE — Telephone Encounter (Signed)
Do we need to send this to referral since scheduling pool cannot make appt for bone density test.

## 2017-11-11 NOTE — Telephone Encounter (Signed)
Patient says she has already taken care of bone density conerns regarding a foot fracture elsewhere and is following Chelle to [facility]. She is no longer a patient at [facility].

## 2017-11-25 ENCOUNTER — Ambulatory Visit: Payer: Medicare HMO | Admitting: Gastroenterology

## 2017-12-31 ENCOUNTER — Encounter: Payer: Self-pay | Admitting: Internal Medicine

## 2018-01-03 ENCOUNTER — Encounter

## 2018-01-10 ENCOUNTER — Encounter: Payer: Medicare HMO | Admitting: Internal Medicine

## 2018-03-21 ENCOUNTER — Encounter: Payer: Medicare HMO | Admitting: Internal Medicine

## 2018-03-25 IMAGING — DX DG KNEE COMPLETE 4+V*L*
4 series · 4 of 4 positions shown · non-contrast
Comparison: Plain films of the left knee 05/22/2016.

CLINICAL DATA: Left knee pain since a fall 02/06/2017. Initial
encounter.

EXAM:
LEFT KNEE - COMPLETE 4+ VIEW

[knee ap]
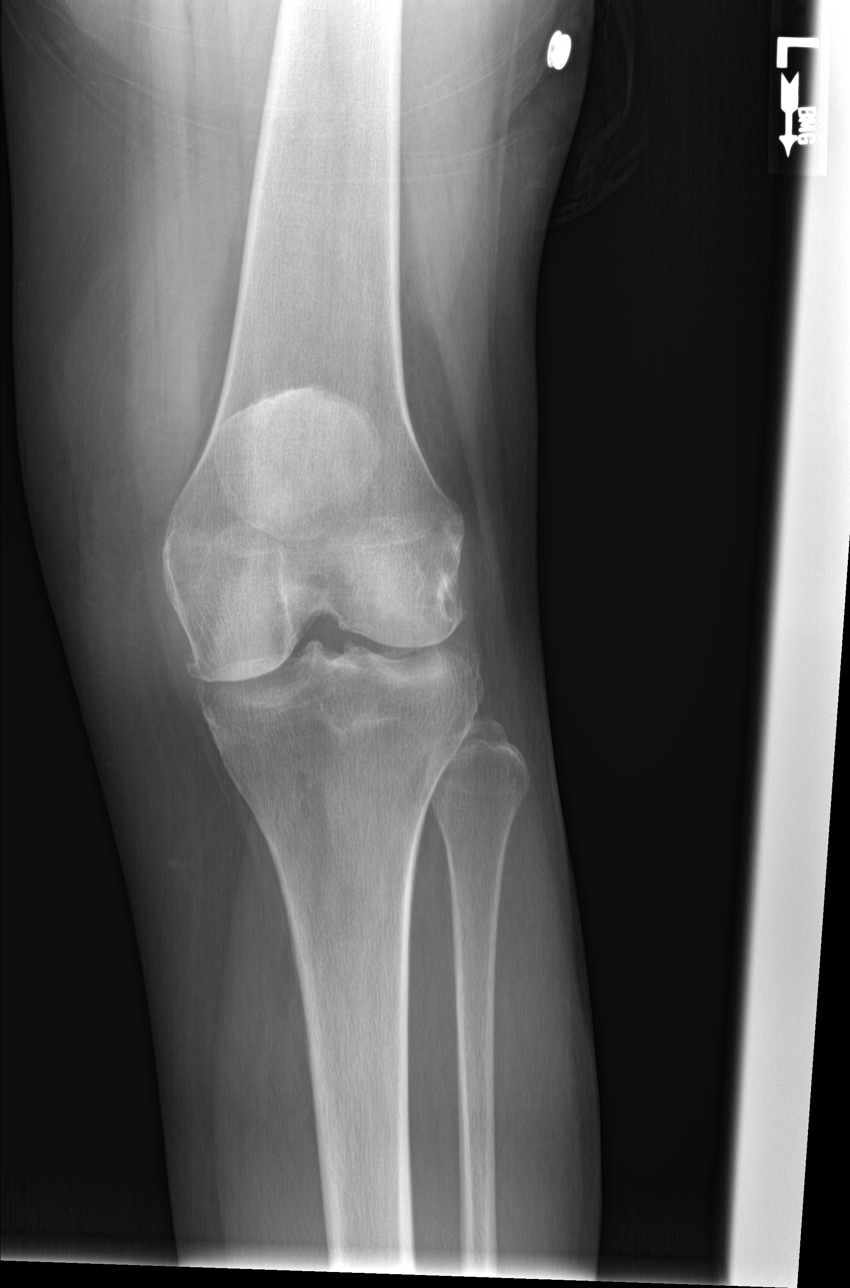

[knee lat]
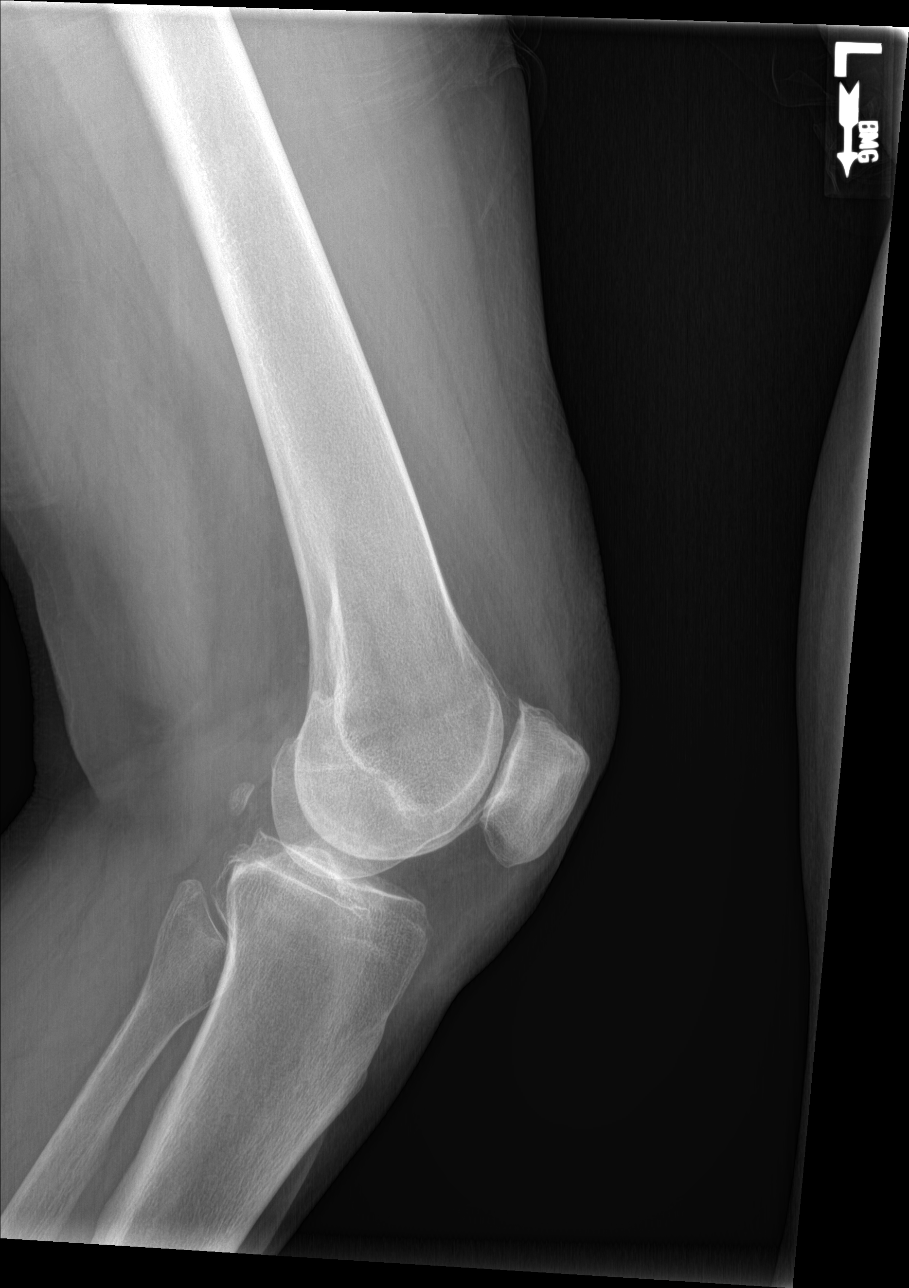

[sunrise]
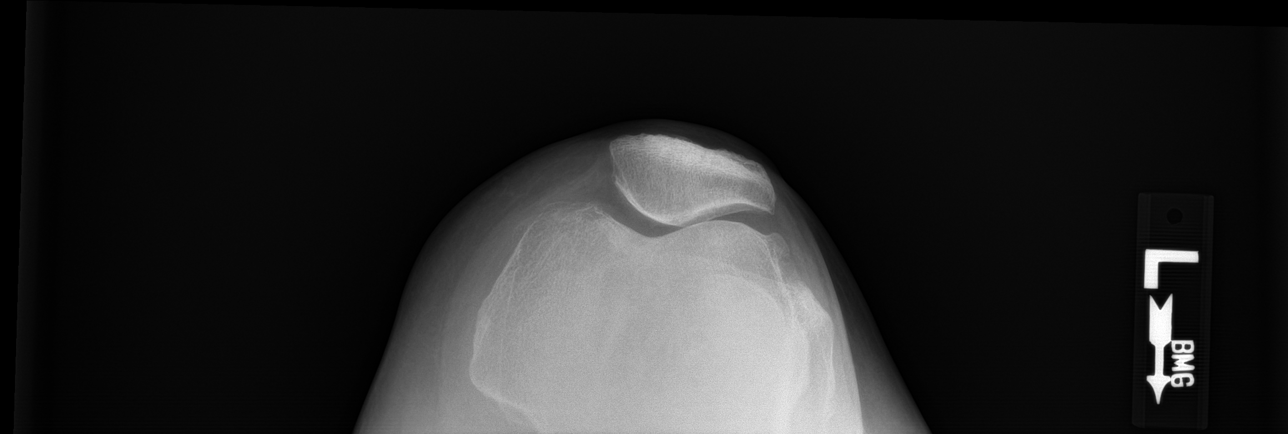

[knee [person_name]]
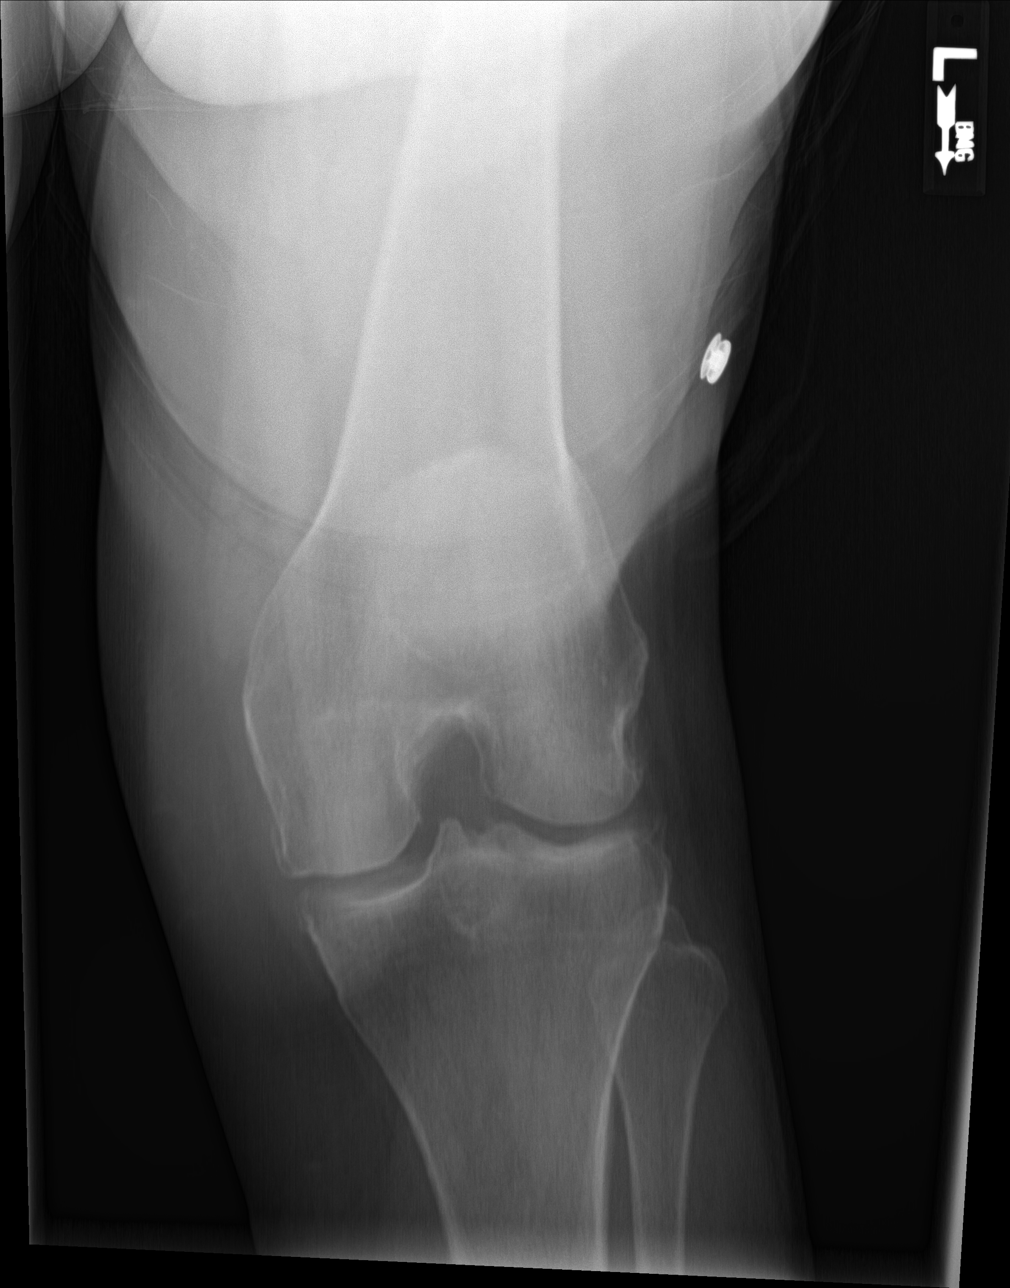

[4 of 4 positions shown; findings below may reference images not displayed]

FINDINGS: There is no acute bony or joint abnormality. Mild-to-moderate
degenerative change about the is again seen and stable in
appearance. No joint effusion.
IMPRESSION: No acute abnormality.  Stable compared to prior exam.

## 2018-04-09 ENCOUNTER — Encounter: Payer: Self-pay | Admitting: Internal Medicine

## 2018-04-09 ENCOUNTER — Other Ambulatory Visit: Payer: Self-pay

## 2018-04-09 ENCOUNTER — Ambulatory Visit (AMBULATORY_SURGERY_CENTER): Payer: Self-pay | Admitting: *Deleted

## 2018-04-09 VITALS — Ht 68.0 in | Wt 186.0 lb

## 2018-04-09 DIAGNOSIS — Z1211 Encounter for screening for malignant neoplasm of colon: Secondary | ICD-10-CM

## 2018-04-09 NOTE — Progress Notes (Signed)
No egg or soy allergy known to patient  No issues with past sedation with any surgeries  or procedures, no intubation problems  No diet pills per patient No home 02 use per patient  No blood thinners per patient  Pt denies issues with constipation  No A fib or A flutter  EMMI video offered and declined Waiver signed for OSP

## 2018-04-21 ENCOUNTER — Telehealth: Payer: Self-pay | Admitting: Internal Medicine

## 2018-04-21 NOTE — Telephone Encounter (Signed)
Patient can not find the banana popsicles at any stores. encouraged patient to drink plenty of fluids.

## 2018-04-21 NOTE — Telephone Encounter (Signed)
Pt has addit questions from her per visit

## 2018-04-23 ENCOUNTER — Encounter: Payer: Self-pay | Admitting: Internal Medicine

## 2018-04-23 ENCOUNTER — Ambulatory Visit (AMBULATORY_SURGERY_CENTER): Payer: Medicare HMO | Admitting: Internal Medicine

## 2018-04-23 ENCOUNTER — Encounter

## 2018-04-23 VITALS — BP 110/76 | HR 71 | Temp 97.1°F | Resp 16 | Ht 68.0 in | Wt 186.0 lb

## 2018-04-23 DIAGNOSIS — Z1211 Encounter for screening for malignant neoplasm of colon: Secondary | ICD-10-CM

## 2018-04-23 MED ORDER — SODIUM CHLORIDE 0.9 % IV SOLN: 500.0000 mL | Freq: Once | INTRAVENOUS | Status: DC

## 2018-04-23 NOTE — Progress Notes (Signed)
Pt's states no medical or surgical changes since previsit or office visit. 

## 2018-04-23 NOTE — Progress Notes (Signed)
A/O x3, VSS, report given to RN

## 2018-04-23 NOTE — Patient Instructions (Addendum)
No polyps or cancer seen.  You do have diverticulosis - thickened muscle rings and pouches in the colon wall. Please read the handout about this condition.  I appreciate the opportunity to care for you. Gatha Mayer, MD, FACG   YOU HAD AN ENDOSCOPIC PROCEDURE TODAY AT [facility]:   Refer to the procedure report that was given to you for any specific questions about what was found during the examination.  If the procedure report does not answer your questions, please call your gastroenterologist to clarify.  If you requested that your care partner not be given the details of your procedure findings, then the procedure report has been included in a sealed envelope for you to review at your convenience later.  YOU SHOULD EXPECT: Some feelings of bloating in the abdomen. Passage of more gas than usual.  Walking can help get rid of the air that was put into your GI tract during the procedure and reduce the bloating. If you had a lower endoscopy (such as a colonoscopy or flexible sigmoidoscopy) you may notice spotting of blood in your stool or on the toilet paper. If you underwent a bowel prep for your procedure, you may not have a normal bowel movement for a few days.  Please Note:  You might notice some irritation and congestion in your nose or some drainage.  This is from the oxygen used during your procedure.  There is no need for concern and it should clear up in a day or so.  SYMPTOMS TO REPORT IMMEDIATELY:   Following lower endoscopy (colonoscopy or flexible sigmoidoscopy):  Excessive amounts of blood in the stool  Significant tenderness or worsening of abdominal pains  Swelling of the abdomen that is new, acute  Fever of 100F or higher   For urgent or emergent issues, a gastroenterologist can be reached at any hour by calling [phone].   DIET:  We do recommend a small meal at first, but then you may proceed to your regular diet.  Drink plenty of  fluids but you should avoid alcoholic beverages for 24 hours.  ACTIVITY:  You should plan to take it easy for the rest of today and you should NOT DRIVE or use heavy machinery until tomorrow (because of the sedation medicines used during the test).    FOLLOW UP: Our staff will call the number listed on your records the next business day following your procedure to check on you and address any questions or concerns that you may have regarding the information given to you following your procedure. If we do not reach you, we will leave a message.  However, if you are feeling well and you are not experiencing any problems, there is no need to return our call.  We will assume that you have returned to your regular daily activities without incident.  If any biopsies were taken you will be contacted by phone or by letter within the next 1-3 weeks.  Please call us at [phone] if you have not heard about the biopsies in 3 weeks.    SIGNATURES/CONFIDENTIALITY: You and/or your care partner have signed paperwork which will be entered into your electronic medical record.  These signatures attest to the fact that that the information above on your After Visit Summary has been reviewed and is understood.  Full responsibility of the confidentiality of this discharge information lies with you and/or your care-partner.  Read all handouts given to you by your recovery room nurse.

## 2018-04-24 ENCOUNTER — Telehealth: Payer: Self-pay

## 2018-04-24 NOTE — Telephone Encounter (Signed)
  Follow up Call-  Call back number 04/23/2018  Post procedure Call Back phone  # [phone]  Permission to leave phone message Yes  Some recent data might be hidden     Patient questions:  Do you have a fever, pain , or abdominal swelling? No. Pain Score  0 *  Have you tolerated food without any problems? No.  Have you been able to return to your normal activities? Yes.    Do you have any questions about your discharge instructions: Diet   No. Medications  No. Follow up visit  No.  Do you have questions or concerns about your Care? No.  Actions: * If pain score is 4 or above: No action needed, pain <4.

## 2018-04-24 NOTE — Op Note (Signed)
[facility] Patient Name: Brooke Sullivan Procedure Date: 04/23/2018 7:52 AM MRN: [id number] Endoscopist: Gatha Mayer , MD Age: 67 Referring MD:  Date of Birth: 24-Oct-1950 Gender: Female Account #: [id number] Procedure:                Colonoscopy Indications:              Screening for colorectal malignant neoplasm, Last                            colonoscopy: 2008 Medicines:                Propofol per Anesthesia, Monitored Anesthesia Care Procedure:                Pre-Anesthesia Assessment:                           - Prior to the procedure, a History and Physical                            was performed, and patient medications and                            allergies were reviewed. The patient's tolerance of                            previous anesthesia was also reviewed. The risks                            and benefits of the procedure and the sedation                            options and risks were discussed with the patient.                            All questions were answered, and informed consent                            was obtained. Prior Anticoagulants: The patient has                            taken no previous anticoagulant or antiplatelet                            agents. ASA Grade Assessment: II - A patient with                            mild systemic disease. After reviewing the risks                            and benefits, the patient was deemed in                            satisfactory condition to undergo the procedure.  After obtaining informed consent, the colonoscope                            was passed under direct vision. Throughout the                            procedure, the patient's blood pressure, pulse, and                            oxygen saturations were monitored continuously. The                            Colonoscope was introduced through the anus and                            advanced to the the  cecum, identified by                            appendiceal orifice and ileocecal valve. The                            ileocecal valve, appendiceal orifice, and rectum                            were photographed. The quality of the bowel                            preparation was good. The bowel preparation used                            was Miralax. Scope In: 7:58:36 AM Scope Out: 8:18:22 AM Scope Withdrawal Time: 0 hours 15 minutes 44 seconds  Total Procedure Duration: 0 hours 19 minutes 46 seconds  Findings:                 The perianal and digital rectal examinations were                            normal.                           Multiple diverticula were found in the sigmoid                            colon and ascending colon. There was narrowing of                            the colon in association with the diverticular                            opening in the sigmoid.                           The exam was otherwise without abnormality on  direct and retroflexion views. Complications:            No immediate complications. Estimated blood loss:                            None. Estimated Blood Loss:     Estimated blood loss: none. Impression:               - Severe diverticulosis in the sigmoid colon and                            mild in the ascending colon. There was narrowing of                            the colon in association with the diverticular                            opening.                           - The examination was otherwise normal on direct                            and retroflexion views.                           - No specimens collected. Recommendation:           - Resume previous diet.                           - Continue present medications.                           - Patient has a contact number available for                            emergencies. The signs and symptoms of potential                             delayed complications were discussed with the                            patient. Return to normal activities tomorrow.                            Written discharge instructions were provided to the                            patient.                           - No repeat colonoscopy due to current age (52                            years or older) and the absence of colonic polyps. Gatha Mayer, MD 04/23/2018 8:30:40 AM This report has been  signed electronically.

## 2018-06-04 ENCOUNTER — Ambulatory Visit: Payer: Medicare HMO

## 2018-06-04 ENCOUNTER — Encounter: Payer: Self-pay | Admitting: Podiatry

## 2018-06-04 ENCOUNTER — Ambulatory Visit: Payer: Medicare HMO | Admitting: Podiatry

## 2018-06-04 VITALS — BP 108/64 | HR 96

## 2018-06-04 DIAGNOSIS — B353 Tinea pedis: Secondary | ICD-10-CM

## 2018-06-04 MED ORDER — TERBINAFINE HCL 250 MG PO TABS: 250.0000 mg | ORAL_TABLET | Freq: Every day | ORAL | 0 refills | Status: DC

## 2018-06-04 MED ORDER — CLOTRIMAZOLE-BETAMETHASONE 1-0.05 % EX CREA: 1.0000 "application " | TOPICAL_CREAM | Freq: Two times a day (BID) | CUTANEOUS | 0 refills | Status: DC

## 2018-06-08 NOTE — Progress Notes (Signed)
   HPI: 68 year old female presenting today as a new patient with a chief complaint of dry, cracked skin on the bilateral heel that has been present for the past three weeks. She notes the skin is hard and stiff and prevents her from feeling anything in the area. She reports burning , redness and itching of the plantar aspects of the feet. She has not done anything for treatment. There are no modifying factors noted. Patient is here for further evaluation and treatment.   Past Medical History:  Diagnosis Date  . Arthritis    hands bilateral  . Chest pain, atypical   . Headache(784.0)   . Hypertension   . RLD (ruptured lumbar disc)   . Shortness of breath    with exertion     Physical Exam: General: The patient is alert and oriented x3 in no acute distress.  Dermatology: Pruritus noted to the bilateral feet with hyperkeratosis. Skin is warm, dry and supple bilateral lower extremities. Negative for open lesions or macerations.  Vascular: Palpable pedal pulses bilaterally. No edema or erythema noted. Capillary refill within normal limits.  Neurological: Epicritic and protective threshold grossly intact bilaterally.   Musculoskeletal Exam: Range of motion within normal limits to all pedal and ankle joints bilateral. Muscle strength 5/5 in all groups bilateral.   Assessment: 1. Tinea pedis bilateral    Plan of Care:  1. Patient evaluated. Patient refused X-Rays.   2. Prescription for Lamisil 250 mg #28 provided to patient.  3. Prescription for Lotrisone cream provided to patient.  4. Return to clinic in 4 weeks.       Edrick Kins, DPM [facility]  Dr. Edrick Kins, DPM    [address], [place] [address]                Office [phone]  Fax [phone]

## 2018-06-13 ENCOUNTER — Telehealth: Payer: Self-pay | Admitting: Podiatry

## 2018-06-13 MED ORDER — CLOTRIMAZOLE-BETAMETHASONE 1-0.05 % EX CREA: 1.0000 "application " | TOPICAL_CREAM | Freq: Two times a day (BID) | CUTANEOUS | 0 refills | Status: DC

## 2018-06-13 NOTE — Telephone Encounter (Signed)
Pt called requesting refill of medication, topical ointment. Pt also asked if there was a larger tube of ointment that could be prescribed.  Pharmacy is [facility] [address] in [place]

## 2018-06-13 NOTE — Addendum Note (Signed)
Addended by: Harriett Sine D on: 06/13/2018 12:46 PM   Modules accepted: Orders

## 2018-06-16 NOTE — Telephone Encounter (Signed)
I informed pt the requested cream was confirmed [facility] on 06/13/2018 at 12:46pm. Pt states the feet are no better. I told pt I would send message to Dr. Amalia Hailey.

## 2018-06-16 NOTE — Telephone Encounter (Signed)
I'm calling to get a refill of the clotrimazole ointment.

## 2018-06-17 NOTE — Telephone Encounter (Signed)
Will you please explain to the patient that she needs to take the lamisil pill for 4 weeks and also apply the cream BID and it takes a few weeks before she starts to notice significant results. Follow up appt should be in 4 weeks.

## 2018-06-18 NOTE — Telephone Encounter (Signed)
I informed pt of Dr. Amalia Hailey' statement. Pt states she will still she Dr. Amalia Hailey at [facility] appt.

## 2018-07-01 ENCOUNTER — Encounter: Payer: Self-pay | Admitting: Podiatry

## 2018-07-01 ENCOUNTER — Ambulatory Visit: Payer: Medicare HMO | Admitting: Podiatry

## 2018-07-01 DIAGNOSIS — R234 Changes in skin texture: Secondary | ICD-10-CM

## 2018-07-01 DIAGNOSIS — L309 Dermatitis, unspecified: Secondary | ICD-10-CM

## 2018-07-01 MED ORDER — BETAMETHASONE DIPROPIONATE 0.05 % EX CREA: TOPICAL_CREAM | Freq: Two times a day (BID) | CUTANEOUS | 3 refills | Status: DC

## 2018-07-08 DIAGNOSIS — L308 Other specified dermatitis: Secondary | ICD-10-CM | POA: Diagnosis not present

## 2018-07-08 NOTE — Progress Notes (Signed)
   HPI: Patient presents today for follow-up evaluation regarding possible tinea pedis to the bilateral feet.  Patient was last seen on 06/04/2018 at which time Lamisil 250 mg #28 and Lotrisone cream was dispensed.  Patient completed the prescription for Lamisil has been applying the Lotrisone cream without any improvement of symptoms.  She continues to get burning sensation and she states that her feet feel like they are on fire.  Is very painful to walk and nothing is helping.  She presents for further treatment evaluation  Past Medical History:  Diagnosis Date  . Arthritis    hands bilateral  . Chest pain, atypical   . Headache(784.0)   . Hypertension   . RLD (ruptured lumbar disc)   . Shortness of breath    with exertion          Physical Exam: General: The patient is alert and oriented x3 in no acute distress.  Dermatology: Significant acute inflammatory erythema of the skin is noted along weightbearing surfaces diffusely.  Distinct line between weightbearing and nonweightbearing surfaces.  Hyperkeratosis also noted with fissuring of the skin.  Skin is warm, dry and supple bilateral lower extremities.  Vascular: Palpable pedal pulses bilaterally. No edema or erythema noted. Capillary refill within normal limits.  Neurological: Epicritic and protective threshold grossly intact bilaterally.   Musculoskeletal Exam: Range of motion within normal limits to all pedal and ankle joints bilateral. Muscle strength 5/5 in all groups bilateral.   Assessment: 1.  Dermatitis weightbearing surfaces of bilateral feet  Reactive dermatitis vs. tinea pedis   Plan of Care:  1. Patient evaluated.  2.  To help with the inflammatory component we will prescribe betamethasone cream to be applied 2 times daily 3.  If there is no improvement in 1 week we will refer the patient to dermatology 4.  Return to clinic as needed      Edrick Kins, DPM [facility]  Dr. Edrick Kins,  DPM    [address], [place] [address]                Office [phone]  Fax [phone]

## 2018-08-08 DIAGNOSIS — L308 Other specified dermatitis: Secondary | ICD-10-CM | POA: Diagnosis not present

## 2018-09-01 DIAGNOSIS — G4700 Insomnia, unspecified: Secondary | ICD-10-CM | POA: Diagnosis not present

## 2018-09-01 DIAGNOSIS — I10 Essential (primary) hypertension: Secondary | ICD-10-CM | POA: Diagnosis not present

## 2018-09-01 DIAGNOSIS — L309 Dermatitis, unspecified: Secondary | ICD-10-CM | POA: Diagnosis not present

## 2018-10-23 ENCOUNTER — Telehealth: Payer: Self-pay

## 2018-10-23 NOTE — Telephone Encounter (Signed)
Spoke with patient.  Made patient aware that Dr. Rockey Situ is still limiting patient coming in the office due to [place].  Patient stated " then cancel my appointment because I will not be doing a video chat with my Doctor" Made patient aware that as soon as we can add her back to the schedule we would be in touch.

## 2018-11-03 ENCOUNTER — Ambulatory Visit: Payer: Medicare HMO | Admitting: Cardiovascular Disease

## 2019-03-29 NOTE — Progress Notes (Signed)
Cardiology Office Note  Date:  03/30/2019   ID:  Brooke Sullivan, DOB 10/04/1950, MRN [id number]  PCP:  Patient, No Pcp Per   Chief Complaint  Patient presents with  . Other    Follow up. Patient c/o SOB and swelling in ankles. Meds reviewed verbally with patient.     HPI:  Brooke Sullivan is a 68 y.o. female with a history of Palpitations, tachycardia in the setting of stress Hypertension, previously on amlodipine  family stressors Who presents for follow-up of her tachycardia palpitations Smoked age 17 to late 69s Last seen in clinic May 2018  Significant anxiety at home Rescues dogs, currently has 6 dogs and 2 cats has been out of work some financial issues daughter needing to get her house ready for social worker visit so she can adopt a 72 year old child Works long hours, self-employed  After spending long hours on her feet, developed some leg swelling Ankle swelling r>l Having problems with some contact dermatitis hands and feet Seen for doctors, tried numerous antibiotics and creams with no improvement  More SOB, when stressed When aggravated, gets tight in chest  Taking amlodipine 5 mg daily Not check blood pressure at home  EKG personally reviewed by myself on todays visit Shows normal sinus rhythm rate 71 bpm no significant ST or T-wave changes  Other past medical history reviewed Two-week monitor was performed, 09/2016 Monitor showed normal sinus rhythm 3 short runs of SVT 6 beats Max heart rate 176 bpm Results discussed with her in detail  Prior stress test 07/2012  Fm hx of CVA   PMH:   has a past medical history of Arthritis, Chest pain, atypical, Headache(784.0), Hypertension, RLD (ruptured lumbar disc), and Shortness of breath.  PSH:    Past Surgical History:  Procedure Laterality Date  . COLONOSCOPY    . KNEE ARTHROSCOPY  02/22/2012   Procedure: ARTHROSCOPY KNEE;  Surgeon: Augustin Schooling, MD;  Location: [facility];  Service: Orthopedics;  Laterality:  Left;  left knee arthroscopy  . SPINE SURGERY      Current Outpatient Medications  Medication Sig Dispense Refill  . amLODipine (NORVASC) 2.5 MG tablet Take 2.5 mg by mouth 2 (two) times daily.     Marland Kitchen aspirin EC 81 MG tablet Take 1 tablet (81 mg total) by mouth daily. 90 tablet 3  . ibuprofen (ADVIL,MOTRIN) 200 MG tablet Take 400 mg by mouth every 6 (six) hours as needed.    . zolpidem (AMBIEN) 10 MG tablet Take 1 tablet (10 mg total) by mouth at bedtime as needed. for sleep 30 tablet 0   No current facility-administered medications for this visit.     Allergies:   Augmentin [amoxicillin-pot clavulanate], Codeine, Naproxen, and Sulfonamide derivatives   Social History:  The patient  reports that she quit smoking about 19 years ago. Her smoking use included cigarettes. She has a 20.00 pack-year smoking history. She has never used smokeless tobacco. She reports current alcohol use of about 7.0 standard drinks of alcohol per week. She reports that she does not use drugs.   Family History:   family history includes Alcohol abuse in her father; Depression in her mother; Heart failure (age of onset: 47) in her father; Osteoporosis in her mother.    Review of Systems: Review of Systems  Constitutional: Negative.   Respiratory: Negative.   Cardiovascular: Negative.   Gastrointestinal: Negative.   Musculoskeletal: Negative.   Neurological: Negative.   Psychiatric/Behavioral: The patient is nervous/anxious.   All  other systems reviewed and are negative.   PHYSICAL EXAM: VS:  BP (!) 146/80 (BP Location: Left Arm, Patient Position: Sitting, Cuff Size: Normal)   Pulse 71   Ht 5\' 8"  (1.727 m)   Wt 187 lb (84.8 kg)   BMI 28.43 kg/m  , BMI Body mass index is 28.43 kg/m. Constitutional:  oriented to person, place, and time. No distress.  HENT:  Head: Grossly normal Eyes:  no discharge. No scleral icterus.  Neck: No JVD, no carotid bruits  Cardiovascular: Regular rate and rhythm, no  murmurs appreciated Pulmonary/Chest: Clear to auscultation bilaterally, no wheezes or rails Abdominal: Soft.  no distension.  no tenderness.  Musculoskeletal: Normal range of motion Neurological:  normal muscle tone. Coordination normal. No atrophy Skin: Skin warm and dry Psychiatric: normal affect, pleasant  Recent Labs: No results found for requested labs within last 8760 hours.    Lipid Panel Lab Results  Component Value Date   CHOL 185 09/23/2017   HDL 64 09/23/2017   LDLCALC 107 (H) 09/23/2017   TRIG 72 09/23/2017      Wt Readings from Last 3 Encounters:  03/30/19 187 lb (84.8 kg)  04/23/18 186 lb (84.4 kg)  04/09/18 186 lb (84.4 kg)       ASSESSMENT AND PLAN:  Paroxysmal tachycardia (HCC)  Likely stress-induced Prescription given for propranolol 20 3 times daily as needed  DYSPNEA - Active at baseline, denies shortness of breath with exertion Likely brought on by significant stressors  Anxiety Previous history of significant stressors likely contributing to tachycardia Currently feels well Anxiety issues managed by primary care, may need benzodiazepines as needed but she is declining  CHEST TIGHTNESS-PRESSURE-OTHER Continues to have atypical chest pain coming on with stress Suggested she talk with primary care for benzodiazepine that she can take as needed -Nitroglycerin sl for episodic chest tightness, brought on by stress   Disposition:   F/U 1 year   Total encounter time more than 25 minutes  Greater than 50% was spent in counseling and coordination of care with the patient    No orders of the defined types were placed in this encounter.    Signed, Esmond Plants, M.D., Ph.D. 03/30/2019  [place], [place]

## 2019-03-30 ENCOUNTER — Encounter: Payer: Self-pay | Admitting: Cardiovascular Disease

## 2019-03-30 ENCOUNTER — Ambulatory Visit (INDEPENDENT_AMBULATORY_CARE_PROVIDER_SITE_OTHER): Payer: Medicare HMO | Admitting: Cardiovascular Disease

## 2019-03-30 ENCOUNTER — Other Ambulatory Visit: Payer: Self-pay

## 2019-03-30 VITALS — BP 146/80 | HR 71 | Ht 68.0 in | Wt 187.0 lb

## 2019-03-30 DIAGNOSIS — Z87891 Personal history of nicotine dependence: Secondary | ICD-10-CM

## 2019-03-30 DIAGNOSIS — I479 Paroxysmal tachycardia, unspecified: Secondary | ICD-10-CM | POA: Diagnosis not present

## 2019-03-30 DIAGNOSIS — E782 Mixed hyperlipidemia: Secondary | ICD-10-CM

## 2019-03-30 DIAGNOSIS — I10 Essential (primary) hypertension: Secondary | ICD-10-CM | POA: Diagnosis not present

## 2019-03-30 MED ORDER — NITROGLYCERIN 0.4 MG SL SUBL: 0.4000 mg | SUBLINGUAL_TABLET | SUBLINGUAL | 3 refills | Status: DC | PRN

## 2019-03-30 MED ORDER — PROPRANOLOL HCL 20 MG PO TABS: 20.0000 mg | ORAL_TABLET | Freq: Three times a day (TID) | ORAL | 1 refills | Status: DC | PRN

## 2019-03-30 NOTE — Patient Instructions (Addendum)
Medication Instructions:  Please take propranolol 20 mg up to three times a day as needed for fast heart rate  Nitro SL for chest tightness  If you need a refill on your cardiac medications before your next appointment, please call your pharmacy.    Lab work: No new labs needed   If you have labs (blood work) drawn today and your tests are completely normal, you will receive your results only by: Marland Kitchen MyChart Message (if you have MyChart) OR . A paper copy in the mail If you have any lab test that is abnormal or we need to change your treatment, we will call you to review the results.   Testing/Procedures: No new testing needed   Follow-Up: At [facility], you and your health needs are our priority.  As part of our continuing mission to provide you with exceptional heart care, we have created designated Provider Care Teams.  These Care Teams include your primary Cardiologist (physician) and Advanced Practice Providers (APPs -  Physician Assistants and Nurse Practitioners) who all work together to provide you with the care you need, when you need it.  . You will need a follow up appointment in 12 months .   Please call our office 2 months in advance to schedule this appointment.    . Providers on your designated Care Team:   . Murray Hodgkins, NP . Christell Faith, PA-C . Marrianne Mood, PA-C  Any Other Special Instructions Will Be Listed Below (If Applicable).  For educational health videos Log in to : [email or web address] Or : [email or web address], password : [email or web address]

## 2019-04-01 ENCOUNTER — Ambulatory Visit: Payer: Medicare HMO | Admitting: Cardiovascular Disease

## 2019-04-10 ENCOUNTER — Telehealth: Payer: Self-pay | Admitting: Cardiovascular Disease

## 2019-04-10 NOTE — Telephone Encounter (Signed)
Spoke with patient and line disconnected.

## 2019-04-10 NOTE — Telephone Encounter (Signed)
Patient will like another rx for BP , she is going to discontinue the Amlodipine.

## 2019-04-13 NOTE — Telephone Encounter (Signed)
Spoke with patient and she states that she had increased swelling to lower legs and that Dr. Rockey Situ said she could try a different pill once a day if the swelling continued. I do see mention in his note but not the specific medication. She was very upset about her appointment and that she was left alone with Dr. Rockey Situ and she said that is not protocol. Dr. Rockey Situ states that he was overbooked that day and her appointment was 4:20 and she didn't leave until 5:55 PM. Apologies given for her complaints and advised that I would forward to our supervisor.

## 2019-04-14 NOTE — Telephone Encounter (Signed)
Stop amloidpine Start losartan 50 mg daily Monitor BP at home and call with numbers

## 2019-04-15 MED ORDER — LOSARTAN POTASSIUM 50 MG PO TABS: 50.0000 mg | ORAL_TABLET | Freq: Every day | ORAL | 3 refills | Status: DC

## 2019-04-15 NOTE — Telephone Encounter (Signed)
Spoke with patient and reviewed provider recommendations to discontinue amlodipine and start Losartan 50 mg once daily. She was concerned about the higher MG dose from the 2.5 mg dose. Reviewed that medication is different and Dr. Rockey Situ did want her to monitor blood pressures and to give [place] a call with some of her numbers. She verbalized understanding with no further questions at this time. Due to pricing she did request that I send it to [employer] [facility] [employer]. Updated chart and meds.

## 2019-04-21 ENCOUNTER — Other Ambulatory Visit: Payer: Self-pay | Admitting: Physician Assistant

## 2019-04-21 DIAGNOSIS — Z1231 Encounter for screening mammogram for malignant neoplasm of breast: Secondary | ICD-10-CM

## 2019-04-23 ENCOUNTER — Telehealth: Payer: Self-pay | Admitting: Cardiovascular Disease

## 2019-04-23 NOTE — Telephone Encounter (Signed)
Pt c/o medication issue:  1. Name of Medication: losartan  2. How are you currently taking this medication (dosage and times per day)? 50 MG 1 daily  3. Are you having a reaction (difficulty breathing--STAT)? Lightheaded, dizzy  4. What is your medication issue? Patient calling and has had issues with new medication, losartan.  It is too strong, patient usually takes after dinner and says last night she was so lightheaded and dizzy she thought she might pass out.  Patient is not going to take anymore and would like to know what other options are available.  Please call to discuss.

## 2019-04-23 NOTE — Telephone Encounter (Signed)
Call to patient.  She reports that since starting Losartan, shes had nothing but problems. She was taking in the morning and after having weakness and dizziness she decided to take it in the evening which worked well for sometime. Over the past few days after she takes it in the evening she is unable to stand at all d/t weakness and dizziness. Pt denies having any BP or HR readings.   Pt does not want to take it anymore and is willing to go back on amlodipine while she is waiting for new rx. She does not want to take amlodipine long term d/t leg swelling. She doesn't feel that the swelling has changed at all since stopping it.   Routing to provider to further advise.

## 2019-04-26 NOTE — Telephone Encounter (Signed)
"  She does not want to take amlodipine long term d/t leg swelling. She doesn't feel that the swelling has changed at all since stopping it."  Not sure what that means Wants to go back on it?  It does affect her swelling? What is BP without either losartan or amlodipine  as she is dizzy on losartan without amlodipine

## 2019-04-27 ENCOUNTER — Other Ambulatory Visit: Payer: Self-pay

## 2019-04-27 ENCOUNTER — Ambulatory Visit
Admission: RE | Admit: 2019-04-27 | Discharge: 2019-04-27 | Disposition: A | Discharge status: Home / Self Care | Payer: Medicare HMO | Source: Ambulatory Visit | Attending: Physician Assistant | Admitting: Physician Assistant

## 2019-04-27 DIAGNOSIS — Z1231 Encounter for screening mammogram for malignant neoplasm of breast: Secondary | ICD-10-CM

## 2019-04-27 NOTE — Telephone Encounter (Signed)
Hold losartan, causes "dizziness" Patient requested to hold amloidpine, legs welling  Try lisinopril 10 mg daily Follow up with PMD with bp numbers

## 2019-04-27 NOTE — Telephone Encounter (Signed)
Spoke with patient and she reviewed that while taking losartan she felt dizzy and just didn't feel good. She reports calling with no answer from provider and is very upset. She is upset. She was here for appointment late and said that he was overbooked. She states that due to the increased dizziness she had to stop the losartan and went back to amlodipine. Due to her frequent driving events she does not want to try the half dose of losartan for now and would prefer to see what other options Dr. Rockey Situ may have. She does note increased swelling in legs but the Losartan just was not making her feel well. Advised that I would send message over to Dr. Rockey Situ for his review and she stated that if these delays and repeated calls where she has to go over everything multiple times she is going to find a different doctor. I advised that we try to take care of each call as soon as possible but in some cases it can require 24-48 hours to get a reply. Advised that I would try to discuss her case with Dr. Rockey Situ and get some other recommendations by tomorrow.

## 2019-04-28 NOTE — Telephone Encounter (Signed)
Patient returning call.

## 2019-04-28 NOTE — Telephone Encounter (Signed)
Left voicemail message to call back  

## 2019-04-28 NOTE — Telephone Encounter (Signed)
Spoke with patient and she was not able to discuss at length and would like me to call her back in the morning. Advised I would certainly give her a call back.

## 2019-04-29 MED ORDER — LISINOPRIL 10 MG PO TABS: 10.0000 mg | ORAL_TABLET | Freq: Every day | ORAL | 2 refills | Status: DC

## 2019-04-29 NOTE — Telephone Encounter (Signed)
Spoke with patient and reviewed provider recommendations. Instructed her to discontinue the losartan, discontinue the amlodipine, and start Lisinopril 10 mg once daily. She verbalized understanding and hopes this will help. She did request that I let provider know that she had a "[place] Arrest, stroke, and massive heart attack" (in sarcastic tone) because it took so long for him to respond and that this could all be avoided with a 10 minute phone call. Explained that providers are seeing patients continuous so they are not able to call everyone back unfortunately. She was agreeable with this plan and had no further questions for now.

## 2019-06-10 ENCOUNTER — Telehealth: Payer: Self-pay | Admitting: Cardiovascular Disease

## 2019-06-10 NOTE — Telephone Encounter (Signed)
Patient calling in with 3 concerns:  Patient calling in stating she needs to go back on Losartan and will need another prescription.   Patient swelling has not decreased and is now moving to ankles, medication (amlodipine) is not helping.   Patient is also upset that some of the facts in her chart are wrong that Dr. Rockey Situ documented, and she would like those fixed.    Please advise when to able

## 2019-06-11 NOTE — Telephone Encounter (Signed)
Left voicemail message to call back  

## 2019-06-15 NOTE — Telephone Encounter (Signed)
Patient is returning your call.  

## 2019-06-16 NOTE — Telephone Encounter (Signed)
Patient returning call.

## 2019-06-16 NOTE — Telephone Encounter (Signed)
Patient states that she was on lisinopril and she thinks this was working well for her blood pressures. She has been off amlodipine for over a month and her swelling is still there and it has not improved. Her primary provider recommended compression hose but she is not satisfied with this wanting to know what is causing this. I did try to explain vascular return when on feet all day or long drives. Asked her to try knee high compression socks if possible to see if that would help but she wants to know what your recommendations are and what is causing this. Let her know that I would send to for review and would be in touch with any further instructions.

## 2019-06-16 NOTE — Telephone Encounter (Signed)
Left voicemail message to call back  

## 2019-06-17 MED ORDER — LISINOPRIL 10 MG PO TABS: 10.0000 mg | ORAL_TABLET | Freq: Every day | ORAL | 3 refills | Status: DC

## 2019-06-17 NOTE — Telephone Encounter (Signed)
Spoke with patient and reviewed provider recommendations and sent in refill. She verbalized understanding with no further questions at this time.

## 2019-06-17 NOTE — Telephone Encounter (Signed)
Primary care can renew lisinopril If they decline we can refill lisinopril If no improvement in leg swelling by holding amlodipine, it would suggest dependent edema Would also make sure salt and fluid intake is not excessive Agree with leg elevation, compression hose

## 2019-11-24 ENCOUNTER — Ambulatory Visit: Payer: Medicare HMO | Admitting: Podiatry

## 2019-12-25 ENCOUNTER — Other Ambulatory Visit: Payer: Self-pay

## 2019-12-25 ENCOUNTER — Ambulatory Visit: Payer: Medicare HMO | Admitting: Podiatry

## 2019-12-25 DIAGNOSIS — B351 Tinea unguium: Secondary | ICD-10-CM

## 2019-12-25 DIAGNOSIS — M79674 Pain in right toe(s): Secondary | ICD-10-CM | POA: Diagnosis not present

## 2019-12-25 DIAGNOSIS — M79675 Pain in left toe(s): Secondary | ICD-10-CM

## 2019-12-27 NOTE — Progress Notes (Signed)
   SUBJECTIVE Patient presents to office today complaining of elongated, thickened nails that cause pain while ambulating in shoes.  She is unable to trim her own nails. Patient is here for further evaluation and treatment. 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  She states that she has been taking methotrexate and Stelara injections as per rheumatology with significant relief.  She is also been applying Vaseline under occlusion nightly.  Past Medical History:  Diagnosis Date  . Arthritis    hands bilateral  . Chest pain, atypical   . Headache(784.0)   . Hypertension   . RLD (ruptured lumbar disc)   . Shortness of breath    with exertion    OBJECTIVE General Patient is awake, alert, and oriented x 3 and in no acute distress. Derm Skin is dry and supple bilateral. Negative open lesions or macerations. Remaining integument unremarkable. Nails are tender, long, thickened and dystrophic with subungual debris, consistent with onychomycosis, 1-5 bilateral. No signs of infection noted.  There are some diffuse hyperkeratosis of the skin to the bilateral weightbearing surfaces of the feet with a reactive dermatitis associated to the hyperkeratotic areas Vasc  DP and PT pedal pulses palpable bilaterally. Temperature gradient within normal limits.  Neuro Epicritic and protective threshold sensation grossly intact bilaterally.  Musculoskeletal Exam No symptomatic pedal deformities noted bilateral. Muscular strength within normal limits.  ASSESSMENT 1. Onychodystrophic nails 1-5 bilateral with hyperkeratosis of nails.  2. Onychomycosis of nail due to dermatophyte bilateral 3. Pain in foot bilateral 4.palmoplantar pustular psoriasis bilateral feet  PLAN OF CARE 1. Patient evaluated today.  2. Instructed to maintain good pedal hygiene and foot care.  3. Mechanical debridement of nails  1-5 bilaterally performed using a nail nipper. Filed with dremel without incident.  4.  Continue management with rheumatology for the patient's skin condition 5.  Return to clinic as needed   Edrick Kins, DPM [facility]  Dr. Edrick Kins, [address]                                        [place], [place] [address]                Office [phone]  Fax [phone]

## 2019-12-29 ENCOUNTER — Ambulatory Visit: Payer: Medicare HMO | Admitting: Podiatry

## 2020-04-08 NOTE — Progress Notes (Signed)
Cardiology Office Note  Date:  04/11/2020   ID:  Brooke Sullivan, DOB July 01, 1950, MRN [id number]  PCP:  Harrison Mons, PA   Chief Complaint  Patient presents with  . Annual Exam    Pt states no new Sx. Did not have Propranolol filled or Nitroglycerin. States her chest occasionally feels tight, heart does not race as much. Has been having a horrible cough from lisinopril. "tired of the cough"    HPI:  Brooke Sullivan is a 69 y.o. female with a history of Palpitations, tachycardia in the setting of stress Hypertension  family stressors, rescues dogs Smoked age 20 to late 44s Who presents for follow-up of her tachycardia palpitations  Last seen in clinic May 2018 On last clinic visit amlodipine changed secondary to leg swelling Started on Losartan 50 , felt dizzy after trying it ("flushed it") does not have any more medication left Changed to lisinopril 10 mg daily (now with cough) Would like to change the medication again  Still with anxiety/stress Rescues dogs,  Works long hours, self-employed  Periodic tachycardia, did not pick up propranolol in the past  EKG personally reviewed by myself on todays visit Shows normal sinus rhythm rate 77 bpm no significant ST or T-wave changes  Other past medical history reviewed Two-week monitor was performed, 09/2016 Monitor showed normal sinus rhythm 3 short runs of SVT 6 beats Max heart rate 176 bpm  Fm hx of CVA   PMH:   has a past medical history of Arthritis, Chest pain, atypical, Headache(784.0), Hypertension, RLD (ruptured lumbar disc), and Shortness of breath.  PSH:    Past Surgical History:  Procedure Laterality Date  . COLONOSCOPY    . KNEE ARTHROSCOPY  02/22/2012   Procedure: ARTHROSCOPY KNEE;  Surgeon: Augustin Schooling, MD;  Location: [facility];  Service: Orthopedics;  Laterality: Left;  left knee arthroscopy  . SPINE SURGERY      Current Outpatient Medications  Medication Sig Dispense Refill  . aspirin EC 81 MG tablet  Take 1 tablet (81 mg total) by mouth daily. 90 tablet 3  . ibuprofen (ADVIL,MOTRIN) 200 MG tablet Take 400 mg by mouth every 6 (six) hours as needed.    . zolpidem (AMBIEN) 10 MG tablet Take 1 tablet (10 mg total) by mouth at bedtime as needed. for sleep 30 tablet 0  . losartan (COZAAR) 25 MG tablet Take 0.5 tablets (12.5 mg total) by mouth daily. 30 tablet 6  . propranolol (INDERAL) 10 MG tablet Take 1 tablet (10 mg total) by mouth 3 (three) times daily as needed (for tachycardia). 30 tablet 1   No current facility-administered medications for this visit.    Allergies:   Augmentin [amoxicillin-pot clavulanate], Codeine, Naproxen, and Sulfonamide derivatives   Social History:  The patient  reports that she quit smoking about 20 years ago. Her smoking use included cigarettes. She has a 20.00 pack-year smoking history. She has never used smokeless tobacco. She reports current alcohol use of about 7.0 standard drinks of alcohol per week. She reports that she does not use drugs.   Family History:   family history includes Alcohol abuse in her father; Depression in her mother; Heart failure (age of onset: 71) in her father; Osteoporosis in her mother.    Review of Systems: Review of Systems  Constitutional: Negative.   Respiratory: Negative.   Cardiovascular: Negative.   Gastrointestinal: Negative.   Musculoskeletal: Negative.   Neurological: Negative.   Psychiatric/Behavioral: The patient is nervous/anxious.   All  other systems reviewed and are negative.   PHYSICAL EXAM: VS:  BP 140/62   Pulse 77   Ht 5\' 8"  (1.727 m)   Wt 185 lb (83.9 kg)   BMI 28.13 kg/m  , BMI Body mass index is 28.13 kg/m. Constitutional:  oriented to person, place, and time. No distress.  HENT:  Head: Grossly normal Eyes:  no discharge. No scleral icterus.  Neck: No JVD, no carotid bruits  Cardiovascular: Regular rate and rhythm, no murmurs appreciated Pulmonary/Chest: Clear to auscultation bilaterally, no  wheezes or rails Abdominal: Soft.  no distension.  no tenderness.  Musculoskeletal: Normal range of motion Neurological:  normal muscle tone. Coordination normal. No atrophy Skin: Skin warm and dry Psychiatric: normal affect, pleasant  Recent Labs: No results found for requested labs within last 8760 hours.    Lipid Panel Lab Results  Component Value Date   CHOL 185 09/23/2017   HDL 64 09/23/2017   LDLCALC 107 (H) 09/23/2017   TRIG 72 09/23/2017    Wt Readings from Last 3 Encounters:  04/11/20 185 lb (83.9 kg)  03/30/19 187 lb (84.8 kg)  04/23/18 186 lb (84.4 kg)     ASSESSMENT AND PLAN:  Paroxysmal tachycardia (HCC)  No significant sx, Did not fill propranolol She would like a new prescription of propranolol, 10 mg 3 times daily as needed sent in  DYSPNEA - Recommend lifestyle modification, walking program  Anxiety Previous history of significant stressors likely contributing to tachycardia Anxiety issues managed by primary care  CHEST TIGHTNESS-PRESSURE-OTHER Atypical chest pain, likely exacerbated by stress Presenting at rest," feels tight in bed, feels tired in the office today"  Hypertension Recommend she retry losartan 12.5 mg daily Not clear if she even needs blood pressure medication She will monitor at home Concerned that it goes up when she is anxious or stressed    Total encounter time more than 25 minutes  Greater than 50% was spent in counseling and coordination of care with the patient    Orders Placed This Encounter  Procedures  . EKG 12-Lead     Signed, Esmond Plants, M.D., Ph.D. 04/11/2020  [place], [place]

## 2020-04-11 ENCOUNTER — Other Ambulatory Visit: Payer: Self-pay

## 2020-04-11 ENCOUNTER — Ambulatory Visit: Payer: Medicare HMO | Admitting: Cardiovascular Disease

## 2020-04-11 ENCOUNTER — Encounter: Payer: Self-pay | Admitting: Cardiovascular Disease

## 2020-04-11 VITALS — BP 140/62 | HR 77 | Ht 68.0 in | Wt 185.0 lb

## 2020-04-11 DIAGNOSIS — I10 Essential (primary) hypertension: Secondary | ICD-10-CM | POA: Diagnosis not present

## 2020-04-11 DIAGNOSIS — I479 Paroxysmal tachycardia, unspecified: Secondary | ICD-10-CM

## 2020-04-11 DIAGNOSIS — Z87891 Personal history of nicotine dependence: Secondary | ICD-10-CM

## 2020-04-11 DIAGNOSIS — E782 Mixed hyperlipidemia: Secondary | ICD-10-CM | POA: Diagnosis not present

## 2020-04-11 MED ORDER — PROPRANOLOL HCL 10 MG PO TABS: 10.0000 mg | ORAL_TABLET | Freq: Three times a day (TID) | ORAL | 1 refills | Status: DC | PRN

## 2020-04-11 MED ORDER — LOSARTAN POTASSIUM 25 MG PO TABS: 12.5000 mg | ORAL_TABLET | Freq: Every day | ORAL | 6 refills | Status: DC

## 2020-04-11 NOTE — Patient Instructions (Addendum)
Medication Instructions:   Start losartan 12.5 mg daily (1/2 of the 25 mg pill)/this has been sent to pharmacy  Monitor pressures , call with numbers if elevated  STOP the LISINOPRIL, this medication has been discontinued for you  Propranolol 10mg  as needed for fast heart beat  If you need a refill on your cardiac medications before your next appointment, please call your pharmacy.    Lab work: No new labs needed   If you have labs (blood work) drawn today and your tests are completely normal, you will receive your results only by: Marland Kitchen MyChart Message (if you have MyChart) OR . A paper copy in the mail If you have any lab test that is abnormal or we need to change your treatment, we will call you to review the results.   Testing/Procedures: No new testing needed   Follow-Up: At [facility], you and your health needs are our priority.  As part of our continuing mission to provide you with exceptional heart care, we have created designated Provider Care Teams.  These Care Teams include your primary Cardiologist (physician) and Advanced Practice Providers (APPs -  Physician Assistants and Nurse Practitioners) who all work together to provide you with the care you need, when you need it.  . You will need a follow up appointment in 12 months, APP ok  . Providers on your designated Care Team:   . Murray Hodgkins, NP . Christell Faith, PA-C . Marrianne Mood, PA-C  Any Other Special Instructions Will Be Listed Below (If Applicable).  COVID-19 Vaccine Information can be found at: ShippingScam.co.uk For questions related to vaccine distribution or appointments, please email [email or web address] or call [phone].

## 2020-04-24 ENCOUNTER — Other Ambulatory Visit: Payer: Self-pay | Admitting: Cardiovascular Disease

## 2020-05-02 ENCOUNTER — Telehealth: Payer: Self-pay | Admitting: Cardiovascular Disease

## 2020-05-02 NOTE — Telephone Encounter (Signed)
Patient states she was diagnosed with Covid and asks if we can refer her to get the antiody. Please call and advise.

## 2020-05-03 ENCOUNTER — Other Ambulatory Visit: Payer: Self-pay | Admitting: Unknown Physician Specialty

## 2020-05-03 ENCOUNTER — Telehealth: Payer: Self-pay | Admitting: Unknown Physician Specialty

## 2020-05-03 ENCOUNTER — Ambulatory Visit (HOSPITAL_COMMUNITY)
Admission: RE | Admit: 2020-05-03 | Discharge: 2020-05-03 | Disposition: A | Discharge status: Home / Self Care | Payer: Medicare Other | Source: Ambulatory Visit | Attending: Pulmonary Disease | Admitting: Pulmonary Disease

## 2020-05-03 DIAGNOSIS — I10 Essential (primary) hypertension: Secondary | ICD-10-CM

## 2020-05-03 DIAGNOSIS — U071 COVID-19: Secondary | ICD-10-CM | POA: Diagnosis present

## 2020-05-03 DIAGNOSIS — Z23 Encounter for immunization: Secondary | ICD-10-CM | POA: Diagnosis not present

## 2020-05-03 MED ORDER — SODIUM CHLORIDE 0.9 % IV SOLN: Freq: Once | INTRAVENOUS | Status: AC

## 2020-05-03 MED ORDER — SODIUM CHLORIDE 0.9 % IV SOLN: INTRAVENOUS | Status: DC | PRN

## 2020-05-03 MED ORDER — FAMOTIDINE IN NACL 20-0.9 MG/50ML-% IV SOLN: 20.0000 mg | Freq: Once | INTRAVENOUS | Status: DC | PRN

## 2020-05-03 MED ORDER — ALBUTEROL SULFATE HFA 108 (90 BASE) MCG/ACT IN AERS: 2.0000 | INHALATION_SPRAY | Freq: Once | RESPIRATORY_TRACT | Status: DC | PRN

## 2020-05-03 MED ORDER — EPINEPHRINE 0.3 MG/0.3ML IJ SOAJ: 0.3000 mg | Freq: Once | INTRAMUSCULAR | Status: DC | PRN

## 2020-05-03 MED ORDER — METHYLPREDNISOLONE SODIUM SUCC 125 MG IJ SOLR: 125.0000 mg | Freq: Once | INTRAMUSCULAR | Status: DC | PRN

## 2020-05-03 MED ORDER — DIPHENHYDRAMINE HCL 50 MG/ML IJ SOLN: 50.0000 mg | Freq: Once | INTRAMUSCULAR | Status: DC | PRN

## 2020-05-03 NOTE — Telephone Encounter (Signed)
I connected by phone with Brooke Sullivan on 05/03/2020 at 11:59 AM to discuss the potential use of a new treatment for mild to moderate COVID-19 viral infection in non-hospitalized patients.  This patient is a 69 y.o. female that meets the FDA criteria for Emergency Use Authorization of COVID monoclonal antibody casirivimab/imdevimab, bamlanivimab/eteseviamb, or sotrovimab.  Has a (+) direct SARS-CoV-2 viral test result  Has mild or moderate COVID-19   Is NOT hospitalized due to COVID-19  Is within 10 days of symptom onset  Has at least one of the high risk factor(s) for progression to severe COVID-19 and/or hospitalization as defined in EUA.  Specific high risk criteria : Older age (>/= 71 yo) and Cardiovascular disease or hypertension   I have spoken and communicated the following to the patient or parent/caregiver regarding COVID monoclonal antibody treatment:  1. FDA has authorized the emergency use for the treatment of mild to moderate COVID-19 in adults and pediatric patients with positive results of direct SARS-CoV-2 viral testing who are 11 years of age and older weighing at least 40 kg, and who are at high risk for progressing to severe COVID-19 and/or hospitalization.  2. The significant known and potential risks and benefits of COVID monoclonal antibody, and the extent to which such potential risks and benefits are unknown.  3. Information on available alternative treatments and the risks and benefits of those alternatives, including clinical trials.  4. Patients treated with COVID monoclonal antibody should continue to self-isolate and use infection control measures (e.g., wear mask, isolate, social distance, avoid sharing personal items, clean and disinfect "high touch" surfaces, and frequent handwashing) according to CDC guidelines.   5. The patient or parent/caregiver has the option to accept or refuse COVID monoclonal antibody treatment.  After reviewing this information  with the patient, the patient has agreed to receive one of the available covid 19 monoclonal antibodies and will be provided an appropriate fact sheet prior to infusion. Kathrine Haddock, NP 05/03/2020 11:59 AM  Sx onset 12/8

## 2020-05-03 NOTE — Progress Notes (Signed)
Patient reviewed Fact Sheet for Patients, Parents, and Caregivers for Emergency Use Authorization (EUA) of bamlanivimab/etesevimab for the Treatment of Coronavirus.  Patient also reviewed and is agreeable to the estimated cost of treatment.  Patient is agreeable to proceed.  

## 2020-05-03 NOTE — Telephone Encounter (Signed)
Pt was contacted by mobile regarding her concern for COVID antibodies, reports was able to get this done today 05/03/2020, her PCP was able to arrange this. Otherwise, all other questions or concerns were address and no additional concerns at this time. Will call back for any questions.

## 2020-05-03 NOTE — Discharge Instructions (Signed)
10 Things You Can Do to Manage Your COVID-19 Symptoms at Home If you have possible or confirmed COVID-19: 1. Stay home from work and school. And stay away from other public places. If you must go out, avoid using any kind of public transportation, ridesharing, or taxis. 2. Monitor your symptoms carefully. If your symptoms get worse, call your healthcare provider immediately. 3. Get rest and stay hydrated. 4. If you have a medical appointment, call the healthcare provider ahead of time and tell them that you have or may have COVID-19. 5. For medical emergencies, call 911 and notify the dispatch personnel that you have or may have COVID-19. 6. Cover your cough and sneezes with a tissue or use the inside of your elbow. 7. Wash your hands often with soap and water for at least 20 seconds or clean your hands with an alcohol-based hand sanitizer that contains at least 60% alcohol. 8. As much as possible, stay in a specific room and away from other people in your home. Also, you should use a separate bathroom, if available. If you need to be around other people in or outside of the home, wear a mask. 9. Avoid sharing personal items with other people in your household, like dishes, towels, and bedding. 10. Clean all surfaces that are touched often, like counters, tabletops, and doorknobs. Use household cleaning sprays or wipes according to the label instructions. cdc.gov/coronavirus 11/19/2018 This information is not intended to replace advice given to you by your health care provider. Make sure you discuss any questions you have with your health care provider. Document Revised: 04/23/2019 Document Reviewed: 04/23/2019 Elsevier Patient Education  2020 Elsevier Inc. What types of side effects do monoclonal antibody drugs cause?  Common side effects  In general, the more common side effects caused by monoclonal antibody drugs include: . Allergic reactions, such as hives or itching . Flu-like signs and  symptoms, including chills, fatigue, fever, and muscle aches and pains . Nausea, vomiting . Diarrhea . Skin rashes . Low blood pressure   The CDC is recommending patients who receive monoclonal antibody treatments wait at least 90 days before being vaccinated.  Currently, there are no data on the safety and efficacy of mRNA COVID-19 vaccines in persons who received monoclonal antibodies or convalescent plasma as part of COVID-19 treatment. Based on the estimated half-life of such therapies as well as evidence suggesting that reinfection is uncommon in the 90 days after initial infection, vaccination should be deferred for at least 90 days, as a precautionary measure until additional information becomes available, to avoid interference of the antibody treatment with vaccine-induced immune responses. If you have any questions or concerns after the infusion please call the Advanced Practice Provider on call at [phone]. This number is ONLY intended for your use regarding questions or concerns about the infusion post-treatment side-effects.  Please do not provide this number to others for use. For return to work notes please contact your primary care provider.   If someone you know is interested in receiving treatment please have them call the COVID hotline at [phone].   

## 2020-05-03 NOTE — Progress Notes (Signed)
  Diagnosis: COVID-19  Physician:   Dr. Asencion Noble  Procedure: Medication fact sheet provided to patient.  Allergies reviewed with patient.  IV placed.  bamlanivimab/etesevimab administered via IV infusion.  Complications: No immediate complications noted.  Discharge: Discharged home   Monna Fam 05/03/2020

## 2020-05-09 ENCOUNTER — Other Ambulatory Visit: Payer: Self-pay | Admitting: Cardiovascular Disease

## 2020-05-23 ENCOUNTER — Other Ambulatory Visit: Payer: Self-pay | Admitting: Cardiovascular Disease

## 2020-06-20 NOTE — Progress Notes (Signed)
Cardiology Office Note  Date:  06/21/2020   ID:  Brooke Sullivan, DOB April 01, 1951, MRN [id number]  PCP:  Harrison Mons, PA   Chief Complaint  Patient presents with  . Other    Discuss medication issues c/o chest pain, headache and sob. Meds reviewed verbally with pt.    HPI:  Brooke Sullivan is a 70 y.o. female with a history of Palpitations, tachycardia in the setting of stress Hypertension  family stressors, rescues dogs Smoked age 39 to late 59s Who presents for follow-up of her tachycardia palpitations  Last seen in clinic November 2021  Losartan low-dose was started for blood pressure, lisinopril held Recommended propranolol low-dose as needed for palpitations  Covid in Dec 2021 Headaches, nausea, exhausted Got Ab, did not feel better next day  Past 2 weeks, Still with SOB, feels cold,   Left chest pain to neck Started on propranolol as needed, On losartan 1/2 BID, sometimes whole  Fall, hurt shoulder,   She is concerned out heart blockage Would like scan  Some anxiety/stress Rescues dogs,  Works long hours, self-employed  Periodic tachycardia, taking prorpanopl PRN  EKG personally reviewed by myself on todays visit Shows normal sinus rhythm rate 74 bpm no significant ST or T-wave changes  Other past medical history reviewed Two-week monitor was performed, 09/2016 Monitor showed normal sinus rhythm 3 short runs of SVT 6 beats Max heart rate 176 bpm  Fm hx of CVA   PMH:   has a past medical history of Arthritis, Chest pain, atypical, Headache(784.0), Hypertension, RLD (ruptured lumbar disc), and Shortness of breath.  PSH:    Past Surgical History:  Procedure Laterality Date  . COLONOSCOPY    . KNEE ARTHROSCOPY  02/22/2012   Procedure: ARTHROSCOPY KNEE;  Surgeon: Augustin Schooling, MD;  Location: [facility];  Service: Orthopedics;  Laterality: Left;  left knee arthroscopy  . SPINE SURGERY      Current Outpatient Medications  Medication Sig Dispense Refill   . ibuprofen (ADVIL,MOTRIN) 200 MG tablet Take 400 mg by mouth every 6 (six) hours as needed.    Marland Kitchen losartan (COZAAR) 25 MG tablet Take 0.5 tablets (12.5 mg total) by mouth daily. 30 tablet 6  . propranolol (INDERAL) 10 MG tablet TAKE 1 TABLET (10 MG TOTAL) BY MOUTH 3 (THREE) TIMES DAILY AS NEEDED (FOR TACHYCARDIA). 180 tablet 0  . zolpidem (AMBIEN) 10 MG tablet Take 1 tablet (10 mg total) by mouth at bedtime as needed. for sleep 30 tablet 0   No current facility-administered medications for this visit.    Allergies:   Augmentin [amoxicillin-pot clavulanate], Codeine, Naproxen, and Sulfonamide derivatives   Social History:  The patient  reports that she quit smoking about 20 years ago. Her smoking use included cigarettes. She has a 20.00 pack-year smoking history. She has never used smokeless tobacco. She reports current alcohol use of about 7.0 standard drinks of alcohol per week. She reports that she does not use drugs.   Family History:   family history includes Alcohol abuse in her father; Depression in her mother; Heart failure (age of onset: 33) in her father; Osteoporosis in her mother.    Review of Systems: Review of Systems  Constitutional: Negative.   HENT: Negative.   Respiratory: Negative.   Cardiovascular: Negative.   Gastrointestinal: Negative.   Musculoskeletal: Negative.   Neurological: Negative.   Psychiatric/Behavioral: The patient is nervous/anxious.   All other systems reviewed and are negative.   PHYSICAL EXAM: VS:  BP 128/70 (  BP Location: Left Arm, Patient Position: Sitting, Cuff Size: Normal)   Pulse 74   Ht 5\' 8"  (1.727 m)   Wt 183 lb 2 oz (83.1 kg)   SpO2 96%   BMI 27.84 kg/m  , BMI Body mass index is 27.84 kg/m. Constitutional:  oriented to person, place, and time. No distress.  HENT:  Head: Grossly normal Eyes:  no discharge. No scleral icterus.  Neck: No JVD, no carotid bruits  Cardiovascular: Regular rate and rhythm, no murmurs  appreciated Pulmonary/Chest: Clear to auscultation bilaterally, no wheezes or rails Abdominal: Soft.  no distension.  no tenderness.  Musculoskeletal: Normal range of motion Neurological:  normal muscle tone. Coordination normal. No atrophy Skin: Skin warm and dry Psychiatric: normal affect, pleasant   Recent Labs: No results found for requested labs within last 8760 hours.    Lipid Panel Lab Results  Component Value Date   CHOL 185 09/23/2017   HDL 64 09/23/2017   LDLCALC 107 (H) 09/23/2017   TRIG 72 09/23/2017    Wt Readings from Last 3 Encounters:  06/21/20 183 lb 2 oz (83.1 kg)  04/11/20 185 lb (83.9 kg)  03/30/19 187 lb (84.8 kg)     ASSESSMENT AND PLAN:  Paroxysmal tachycardia (HCC)  Taking propranolol as needed  DYSPNEA - Etiology unclear unclear, post covid Unable to exclude ischemia as detailed below  Anxiety Stress at home, sick dog  CHEST TIGHTNESS-PRESSURE-OTHER Chest pain/angina, SOB, getting worse Long discussion concerning various treatment options Suggested she consider lexiscan myoview to rule out ischemia Also discussed cardiac CTA Given worsening symptoms, would not recommend medical management at this time Reports she is unable to treadmill secondary to worsening shortness of breath She prefers Myoview, this has been scheduled for her  Hypertension Blood pressure is well controlled on today's visit. No changes made to the medications.    Total encounter time more than 35 minutes  Greater than 50% was spent in counseling and coordination of care with the patient    No orders of the defined types were placed in this encounter.    Signed, Esmond Plants, M.D., Ph.D. 06/21/2020  [place], [place]

## 2020-06-21 ENCOUNTER — Encounter: Payer: Self-pay | Admitting: Cardiovascular Disease

## 2020-06-21 ENCOUNTER — Other Ambulatory Visit: Payer: Self-pay

## 2020-06-21 ENCOUNTER — Ambulatory Visit: Payer: Medicare HMO | Admitting: Cardiovascular Disease

## 2020-06-21 VITALS — BP 128/70 | HR 74 | Ht 68.0 in | Wt 183.1 lb

## 2020-06-21 DIAGNOSIS — I479 Paroxysmal tachycardia, unspecified: Secondary | ICD-10-CM

## 2020-06-21 DIAGNOSIS — R0602 Shortness of breath: Secondary | ICD-10-CM

## 2020-06-21 DIAGNOSIS — R079 Chest pain, unspecified: Secondary | ICD-10-CM

## 2020-06-21 DIAGNOSIS — I10 Essential (primary) hypertension: Secondary | ICD-10-CM | POA: Diagnosis not present

## 2020-06-21 DIAGNOSIS — E782 Mixed hyperlipidemia: Secondary | ICD-10-CM

## 2020-06-21 DIAGNOSIS — Z87891 Personal history of nicotine dependence: Secondary | ICD-10-CM

## 2020-06-21 MED ORDER — LOSARTAN POTASSIUM 25 MG PO TABS: 25.0000 mg | ORAL_TABLET | Freq: Every day | ORAL | 4 refills | Status: DC

## 2020-06-21 NOTE — Patient Instructions (Addendum)
Medication Instructions:  Continue your current mediations  Losartan 25 mg was sent to [facility]  Lab work: No new labs needed  Testing/Procedures: Lexiscan myoview  (chest pain, SOB)  [facility] MYOVIEW  Your caregiver has ordered a Stress Test with nuclear imaging. The purpose of this test is to evaluate the blood supply to your heart muscle. This procedure is referred to as a "Non-Invasive Stress Test." This is because other than having an IV started in your vein, nothing is inserted or "invades" your body. Cardiac stress tests are done to find areas of poor blood flow to the heart by determining the extent of coronary artery disease (CAD). Some patients exercise on a treadmill, which naturally increases the blood flow to your heart, while others who are  unable to walk on a treadmill due to physical limitations have a pharmacologic/chemical stress agent called Lexiscan . This medicine will mimic walking on a treadmill by temporarily increasing your coronary blood flow.   Please note: these test may take anywhere between 2-4 hours to complete  PLEASE REPORT TO [place] AT THE FIRST DESK WILL DIRECT YOU WHERE TO GO  Date of Procedure:_____________________________________  Arrival Time for Procedure:______________________________  Instructions regarding medication:   _X_:  Hold betablocker(s) night before procedure and morning of procedure  Do not take propranolol  PLEASE NOTIFY THE OFFICE AT LEAST 24 HOURS IN ADVANCE IF YOU ARE UNABLE TO [place].  [phone] AND  PLEASE NOTIFY NUCLEAR MEDICINE AT [facility] AT LEAST 24 HOURS IN ADVANCE IF YOU ARE UNABLE TO KEEP YOUR APPOINTMENT. [phone]  How to prepare for your Myoview test:  1. Do not eat or drink after midnight 2. No caffeine for 24 hours prior to test 3. No smoking 24 hours prior to test. 4. Your medication may be taken with water.  If your doctor stopped a medication because of this  test, do not take that medication. 5. Ladies, please do not wear dresses.  Skirts or pants are appropriate. Please wear a short sleeve shirt. 6. No perfume, cologne or lotion. 7. Wear comfortable walking shoes. No heels!    Follow-Up: At [facility], you and your health needs are our priority.  As part of our continuing mission to provide you with exceptional heart care, we have created designated Provider Care Teams.  These Care Teams include your primary Cardiologist (physician) and Advanced Practice Providers (APPs -  Physician Assistants and Nurse Practitioners) who all work together to provide you with the care you need, when you need it.  . You will need a follow up appointment in 6 months  . Providers on your designated Care Team:   . Murray Hodgkins, NP . Christell Faith, PA-C . Marrianne Mood, PA-C  Any Other Special Instructions Will Be Listed Below (If Applicable).  COVID-19 Vaccine Information can be found at: ShippingScam.co.uk For questions related to vaccine distribution or appointments, please email [email or web address] or call [phone].

## 2020-06-24 ENCOUNTER — Other Ambulatory Visit: Payer: Self-pay

## 2020-06-24 ENCOUNTER — Encounter
Admission: RE | Admit: 2020-06-24 | Discharge: 2020-06-24 | Disposition: A | Discharge status: Home / Self Care | Payer: Medicare HMO | Source: Ambulatory Visit | Attending: Cardiovascular Disease | Admitting: Cardiovascular Disease

## 2020-06-24 DIAGNOSIS — R079 Chest pain, unspecified: Secondary | ICD-10-CM

## 2020-06-24 LAB — NM MYOCAR MULTI W/SPECT W/WALL MOTION / EF
Estimated workload: 1 METS
Exercise duration (min): 0 min
Exercise duration (sec): 0 s
LV dias vol: 71 mL (ref 46–106)
LV sys vol: 20 mL
MPHR: 149 {beats}/min
Peak HR: 113 {beats}/min
Percent HR: 75 %
Rest HR: 74 {beats}/min
SDS: 3
SRS: 3
SSS: 5
TID: 1.09

## 2020-06-24 MED ORDER — TECHNETIUM TC 99M TETROFOSMIN IV KIT
31.8500 | PACK | Freq: Once | INTRAVENOUS | Status: AC | PRN
  Administered 2020-06-24: 31.85 via INTRAVENOUS

## 2020-06-24 MED ORDER — TECHNETIUM TC 99M TETROFOSMIN IV KIT
9.8990 | PACK | Freq: Once | INTRAVENOUS | Status: AC | PRN
  Administered 2020-06-24: 9.899 via INTRAVENOUS

## 2020-06-24 MED ORDER — REGADENOSON 0.4 MG/5ML IV SOLN
0.4000 mg | Freq: Once | INTRAVENOUS | Status: AC
  Administered 2020-06-24: 0.4 mg via INTRAVENOUS

## 2020-06-28 ENCOUNTER — Telehealth: Payer: Self-pay

## 2020-06-28 NOTE — Telephone Encounter (Signed)
Able to reach pt regarding her recent stress test, Dr. Rockey Situ had a chance to review his results and advised   "Stress test  Looked good, no significant ischemia noted  Normal cardiac function"  Pt reports she is grateful of the news, however reports "I do not want another test, that was awful". Pt reports she felt "awful and sick right before I left, I couldn't move, I felt so bad". Reports RN [place] and [employer], was wonderful. Pt st for next stress test, she prefers a different option. Otherwise all questions or concerns were address and no additional concerns at this time.

## 2020-07-08 ENCOUNTER — Ambulatory Visit: Payer: Medicare Other | Admitting: Cardiovascular Disease

## 2020-07-11 ENCOUNTER — Other Ambulatory Visit: Payer: Self-pay | Admitting: Physician Assistant

## 2020-07-11 DIAGNOSIS — Z1231 Encounter for screening mammogram for malignant neoplasm of breast: Secondary | ICD-10-CM

## 2020-08-08 ENCOUNTER — Other Ambulatory Visit: Payer: Self-pay

## 2020-08-08 DIAGNOSIS — I10 Essential (primary) hypertension: Secondary | ICD-10-CM | POA: Diagnosis not present

## 2020-08-08 DIAGNOSIS — S81812A Laceration without foreign body, left lower leg, initial encounter: Secondary | ICD-10-CM | POA: Insufficient documentation

## 2020-08-08 DIAGNOSIS — Z87891 Personal history of nicotine dependence: Secondary | ICD-10-CM | POA: Diagnosis not present

## 2020-08-08 DIAGNOSIS — W540XXA Bitten by dog, initial encounter: Secondary | ICD-10-CM | POA: Insufficient documentation

## 2020-08-08 DIAGNOSIS — S8992XA Unspecified injury of left lower leg, initial encounter: Secondary | ICD-10-CM | POA: Diagnosis present

## 2020-08-08 NOTE — ED Triage Notes (Addendum)
Pt with dog bite to posterior aspect of right leg behind the knee. States was pitbull, large lac noted to area with bleeding controlled. Police report has been filed and dog is utd on rabies vaccine.

## 2020-08-09 ENCOUNTER — Emergency Department
Admission: EM | Admit: 2020-08-09 | Discharge: 2020-08-09 | Disposition: A | Discharge status: Home / Self Care | Payer: Medicare HMO | Attending: Emergency Medicine | Admitting: Emergency Medicine

## 2020-08-09 DIAGNOSIS — W540XXA Bitten by dog, initial encounter: Secondary | ICD-10-CM

## 2020-08-09 MED ORDER — DOXYCYCLINE HYCLATE 100 MG PO TABS
100.0000 mg | ORAL_TABLET | Freq: Once | ORAL | Status: AC
  Administered 2020-08-09: 100 mg via ORAL
  Filled 2020-08-09: qty 1

## 2020-08-09 MED ORDER — DOXYCYCLINE HYCLATE 50 MG PO CAPS: 100.0000 mg | ORAL_CAPSULE | Freq: Two times a day (BID) | ORAL | 0 refills | Status: DC

## 2020-08-09 MED ORDER — LIDOCAINE-EPINEPHRINE 2 %-1:100000 IJ SOLN
20.0000 mL | Freq: Once | INTRAMUSCULAR | Status: DC
  Filled 2020-08-09: qty 1

## 2020-08-09 NOTE — ED Provider Notes (Incomplete)
[facility] Emergency Department Provider Note   ____________________________________________   Event Date/Time   First MD Initiated Contact with Patient 08/09/20 0217     (approximate)  I have reviewed the triage vital signs and the nursing notes.   HISTORY  Chief Complaint Animal Bite    HPI Brooke Sullivan is a 70 y.o. female ***        {**SYMPTOM/COMPLAINT  LOCATION (describe anatomically) DURATION (when did it start) TIMING (onset and pattern) SEVERITY (0-10, mild/moderate/severe) QUALITY (description of symptoms) CONTEXT (recent surgery, new meds, activity, etc.) MODIFYINGFACTORS (what makes it better/worse) ASSOCIATEDSYMPTOMS (pertinent positives and negatives)**} Past Medical History:  Diagnosis Date  . Arthritis    hands bilateral  . Chest pain, atypical   . Headache(784.0)   . Hypertension   . RLD (ruptured lumbar disc)   . Shortness of breath    with exertion    Patient Active Problem List   Diagnosis Date Noted  . Difficulty swallowing liquids 10/04/2017  . Benign essential HTN 09/30/2017  . Change in stool 09/23/2017  . Osteopenia 10/10/2016  . Insomnia 12/17/2015  . DYSPNEA 06/20/2010  . CHEST TIGHTNESS-PRESSURE-OTHER 06/20/2010  . 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 COLONOSCOPY    . KNEE ARTHROSCOPY  02/22/2012   Procedure: ARTHROSCOPY KNEE;  Surgeon: Augustin Schooling, MD;  Location: [place];  Service: Orthopedics;  Laterality: Left;  left knee arthroscopy  . SPINE SURGERY      Prior to Admission medications   Medication Sig Start Date End Date Taking? Authorizing Provider  ibuprofen (ADVIL,MOTRIN) 200 MG tablet Take 400 mg by mouth every 6 (six) hours as needed.    [provider]  losartan (COZAAR) 25 MG tablet Take 1 tablet (25 mg total) by mouth daily. 06/21/20   Minna Merritts, MD  propranolol (INDERAL) 10 MG tablet TAKE 1 TABLET (10 MG  TOTAL) BY MOUTH 3 (THREE) TIMES DAILY AS NEEDED (FOR TACHYCARDIA). 05/09/20   Minna Merritts, MD  zolpidem (AMBIEN) 10 MG tablet Take 1 tablet (10 mg total) by mouth at bedtime as needed. for sleep 09/23/17   Harrison Mons, PA    Allergies Augmentin [amoxicillin-pot clavulanate], Codeine, Naproxen, and Sulfonamide derivatives  Family History  Problem Relation Age of Onset  . Heart failure Father 43  . Alcohol abuse Father   . Depression Mother   . Osteoporosis Mother   . Breast cancer Neg Hx   . Colon cancer Neg Hx   . Colon polyps Neg Hx   . Esophageal cancer Neg Hx   . Stomach cancer Neg Hx   . Rectal cancer Neg Hx     Social History Social History   Tobacco Use  . Smoking status: Former Smoker    Packs/day: 1.00    Years: 20.00    Pack years: 20.00    Types: Cigarettes    Quit date: 09/19/1999    Years since quitting: 20.9  . Smokeless tobacco: Never Used  Vaping Use  . Vaping Use: Never used  Substance Use Topics  . Alcohol use: Yes    Alcohol/week: 7.0 standard drinks    Types: 7 Glasses of wine per week    Comment: socially  . Drug use: No    Review of Systems {** Revise as appropriate then delete this line - Documentation of 10 systems is required  **} Constitutional: No fever/chills Eyes: No visual changes. ENT: No sore throat. Cardiovascular:  Denies chest pain. Respiratory: Denies shortness of breath. Gastrointestinal: No abdominal pain.  No nausea, no vomiting.  No diarrhea.  No constipation. Genitourinary: Negative for dysuria. Musculoskeletal: Negative for back pain. Skin: Negative for rash. Neurological: Negative for headaches, focal weakness or numbness. {**Psychiatric:  Endocrine:  Hematological/Lymphatic:  Allergic/Immunilogical: **}  ____________________________________________   PHYSICAL EXAM:  VITAL SIGNS: ED Triage Vitals [08/08/20 2127]  Enc Vitals Group     BP (!) 168/87     Pulse Rate 87     Resp 20     Temp 98.3 F (36.8  C)     Temp Source Oral     SpO2 98 %     Weight 180 lb (81.6 kg)     Height 5\' 9"  (1.753 m)     Head Circumference      Peak Flow      Pain Score 4     Pain Loc      Pain Edu?      Excl. in [place]?    {** Revise as appropriate then delete this line - 8 systems required **} Constitutional: Alert and oriented. Well appearing and in no acute distress. Eyes: Conjunctivae are normal. PERRL. EOMI. Head: Atraumatic. Nose: No congestion/rhinnorhea. Mouth/Throat: Mucous membranes are moist.  Oropharynx non-erythematous. Neck: No stridor.  {**No cervical spine tenderness to palpation.**} {**Hematological/Lymphatic/Immunilogical: No cervical lymphadenopathy. **}Cardiovascular: Normal rate, regular rhythm. Grossly normal heart sounds.  Good peripheral circulation. Respiratory: Normal respiratory effort.  No retractions. Lungs CTAB. Gastrointestinal: Soft and nontender. No distention. No abdominal bruits. No CVA tenderness. {**Genitourinary:  **}Musculoskeletal: No lower extremity tenderness nor edema.  No joint effusions. Neurologic:  Normal speech and language. No gross focal neurologic deficits are appreciated. No gait instability. Skin:  Skin is warm, dry and intact. No rash noted. Psychiatric: Mood and affect are normal. Speech and behavior are normal.  ____________________________________________   LABS (all labs ordered are listed, but only abnormal results are displayed)  Labs Reviewed - No data to display ____________________________________________  EKG  *** ____________________________________________  RADIOLOGY I, SUNG,JADE J, personally viewed and evaluated these images (plain radiographs) as part of my medical decision making, as well as reviewing the written report by the radiologist.  ED MD interpretation:  ***  Official radiology report(s): No results found.  ____________________________________________   PROCEDURES  Procedure(s) performed (including Critical  Care):  Procedures   ____________________________________________   INITIAL IMPRESSION / ASSESSMENT AND PLAN / ED COURSE  As part of my medical decision making, I reviewed the following data within the electronic MEDICAL RECORD NUMBER {[id number]::"Notes from prior ED visits","[place] Controlled Substance Database"}        ***      ____________________________________________   FINAL CLINICAL IMPRESSION(S) / ED DIAGNOSES  Final diagnoses:  None     ED Discharge Orders    None      *Please note:  Ellar C Bowell was evaluated in Emergency Department on 08/09/2020 for the symptoms described in the history of present illness. She was evaluated in the context of the global COVID-19 pandemic, which necessitated consideration that the patient might be at risk for infection with the SARS-CoV-2 virus that causes COVID-19. Institutional protocols and algorithms that pertain to the evaluation of patients at risk for COVID-19 are in a state of rapid change based on information released by regulatory bodies including the CDC and federal and state organizations. These policies and algorithms were followed during the patient's care in the ED.  Some ED evaluations and interventions may  be delayed as a result of limited staffing during and the pandemic.*   Note:  This document was prepared using Dragon voice recognition software and may include unintentional dictation errors.

## 2020-08-09 NOTE — Discharge Instructions (Signed)
Keep wound clean and dry.  Take antibiotic as prescribed (Doxycycline 100 mg twice daily x7 days).  Wound check in 2 days, either with your PCP or you may return to the ER.  Return to the ER sooner for worsening symptoms, increased redness/swelling, purulent discharge, fever or other concerns.

## 2020-08-09 NOTE — ED Provider Notes (Signed)
[facility] Emergency Department Provider Note   ____________________________________________   Event Date/Time   First MD Initiated Contact with Patient 08/09/20 0217     (approximate)  I have reviewed the triage vital signs and the nursing notes.   HISTORY  Chief Complaint Animal Bite    HPI Brooke Sullivan is a 70 y.o. female who presents to the ED with a chief complaint of dog bite.  Patient presents with dog bite to posterior right leg behind the knee.  States dog is up-to-date on rabies vaccine.  Patient's tetanus is up-to-date.  Voices no other complaints or injuries.     Past Medical History:  Diagnosis Date  . Arthritis    hands bilateral  . Chest pain, atypical   . Headache(784.0)   . Hypertension   . RLD (ruptured lumbar disc)   . Shortness of breath    with exertion    Patient Active Problem List   Diagnosis Date Noted  . Difficulty swallowing liquids 10/04/2017  . Benign essential HTN 09/30/2017  . Change in stool 09/23/2017  . Osteopenia 10/10/2016  . Insomnia 12/17/2015  . DYSPNEA 06/20/2010  . CHEST TIGHTNESS-PRESSURE-OTHER 06/20/2010  . 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 COLONOSCOPY    . KNEE ARTHROSCOPY  02/22/2012   Procedure: ARTHROSCOPY KNEE;  Surgeon: Augustin Schooling, MD;  Location: [place];  Service: Orthopedics;  Laterality: Left;  left knee arthroscopy  . SPINE SURGERY      Prior to Admission medications   Medication Sig Start Date End Date Taking? Authorizing Provider  doxycycline (VIBRAMYCIN) 50 MG capsule Take 2 capsules (100 mg total) by mouth 2 (two) times daily. 08/09/20  Yes Paulette Blanch, MD  ibuprofen (ADVIL,MOTRIN) 200 MG tablet Take 400 mg by mouth every 6 (six) hours as needed.    [provider]  losartan (COZAAR) 25 MG tablet Take 1 tablet (25 mg total) by mouth daily. 06/21/20   Minna Merritts, MD  propranolol (INDERAL) 10 MG  tablet TAKE 1 TABLET (10 MG TOTAL) BY MOUTH 3 (THREE) TIMES DAILY AS NEEDED (FOR TACHYCARDIA). 05/09/20   Minna Merritts, MD  zolpidem (AMBIEN) 10 MG tablet Take 1 tablet (10 mg total) by mouth at bedtime as needed. for sleep 09/23/17   Harrison Mons, PA    Allergies Augmentin [amoxicillin-pot clavulanate], Codeine, Naproxen, and Sulfonamide derivatives  Family History  Problem Relation Age of Onset  . Heart failure Father 36  . Alcohol abuse Father   . Depression Mother   . Osteoporosis Mother   . Breast cancer Neg Hx   . Colon cancer Neg Hx   . Colon polyps Neg Hx   . Esophageal cancer Neg Hx   . Stomach cancer Neg Hx   . Rectal cancer Neg Hx     Social History Social History   Tobacco Use  . Smoking status: Former Smoker    Packs/day: 1.00    Years: 20.00    Pack years: 20.00    Types: Cigarettes    Quit date: 09/19/1999    Years since quitting: 20.9  . Smokeless tobacco: Never Used  Vaping Use  . Vaping Use: Never used  Substance Use Topics  . Alcohol use: Yes    Alcohol/week: 7.0 standard drinks    Types: 7 Glasses of wine per week    Comment: socially  . Drug use: No    Review of Systems  Constitutional: No fever/chills Eyes: No visual changes. ENT: No sore throat. Cardiovascular: Denies chest pain. Respiratory: Denies shortness of breath. Gastrointestinal: No abdominal pain.  No nausea, no vomiting.  No diarrhea.  No constipation. Genitourinary: Negative for dysuria. Musculoskeletal: Positive for dog bite.  Negative for back pain. Skin: Negative for rash. Neurological: Negative for headaches, focal weakness or numbness.   ____________________________________________   PHYSICAL EXAM:  VITAL SIGNS: ED Triage Vitals [08/08/20 2127]  Enc Vitals Group     BP (!) 168/87     Pulse Rate 87     Resp 20     Temp 98.3 F (36.8 C)     Temp Source Oral     SpO2 98 %     Weight 180 lb (81.6 kg)     Height 5\' 9"  (1.753 m)     Head Circumference       Peak Flow      Pain Score 4     Pain Loc      Pain Edu?      Excl. in [employer]?     Constitutional: Alert and oriented. Well appearing and in no acute distress. Eyes: Conjunctivae are normal. PERRL. EOMI. Head: Atraumatic. Nose: No congestion/rhinnorhea. Mouth/Throat: Mucous membranes are moist.   Neck: No stridor.   Cardiovascular: Normal rate, regular rhythm. Grossly normal heart sounds.  Good peripheral circulation. Respiratory: Normal respiratory effort.  No retractions. Lungs CTAB. Gastrointestinal: Soft and nontender. No distention. No abdominal bruits. No CVA tenderness. Musculoskeletal:  RLE: Approximately 6 x 4 cm avulsion type laceration to posterior right knee, nonbleeding with adipose tissue only visualized.  2+ distal pulses.  Brisk, less than 5-second capillary refill. Neurologic:  Normal speech and language. No gross focal neurologic deficits are appreciated. No gait instability.  Ambulatory with steady gait. Skin:  Skin is warm, dry and intact. No rash noted. Psychiatric: Mood and affect are normal. Speech and behavior are normal.  ____________________________________________   LABS (all labs ordered are listed, but only abnormal results are displayed)  Labs Reviewed - No data to display ____________________________________________  EKG  None ____________________________________________  RADIOLOGY I, Taila Basinski J, personally viewed and evaluated these images (plain radiographs) as part of my medical decision making, as well as reviewing the written report by the radiologist.  ED MD interpretation: None  Official radiology report(s): No results found.  ____________________________________________   PROCEDURES  Procedure(s) performed (including Critical Care):  Procedures  .Marland KitchenLaceration Repair  Date/Time: 08/09/2020 3:02 AM Performed by: Paulette Blanch, MD Authorized by: Paulette Blanch, MD   Consent:    Consent obtained:  Verbal   Consent given by:   Patient   Risks discussed:  Infection, pain, poor cosmetic result and poor wound healing Anesthesia:    Anesthesia method:  None Laceration details:    Location:  Leg   Leg location: Posterior right lower thigh/knee.   Length (cm):  6 Exploration:    Hemostasis achieved with:  Direct pressure   Wound exploration: wound explored through full range of motion and entire depth of wound visualized   Treatment:    Area cleansed with:  Saline and chlorhexidine   Amount of cleaning:  Extensive   Irrigation solution:  Sterile saline   Irrigation method:  Tap   Visualized foreign bodies/material removed: no   Repair type:    Repair type:  Simple Post-procedure details:    Dressing:  Sterile dressing and non-adherent dressing   Procedure completion:  Tolerated well, no immediate complications  ____________________________________________   INITIAL IMPRESSION / ASSESSMENT AND PLAN / ED COURSE  As part of my medical decision making, I reviewed the following data within the [facility] notes reviewed and incorporated and Notes from prior ED visits     70 year old female presenting with dog bite.  Wound is a moderately sized 6 x 4cm avulsion type laceration which is unable to be sutured due to its location in the posterior aspect behind right knee at the bend.  Chunk of skin missing with adipose tissue exposed.  Area was cleansed extensively with normal saline and chlorhexidine.  Nonadherent dressing was applied with Kerlix and Coban overlying.  Placed on antibiotic (patient allergic to Augmentin), wound check in 2 days.  If patient requires, patient states she will go to the [facility] wound clinic.  Strict return precautions given.  Patient verbalizes understanding and agrees with plan of care.      ____________________________________________   FINAL CLINICAL IMPRESSION(S) / ED DIAGNOSES  Final diagnoses:  Dog bite, initial encounter     ED Discharge  Orders         Ordered    doxycycline (VIBRAMYCIN) 50 MG capsule  2 times daily        08/09/20 0257          *Please note:  Brooke Sullivan was evaluated in Emergency Department on 08/09/2020 for the symptoms described in the history of present illness. She was evaluated in the context of the global COVID-19 pandemic, which necessitated consideration that the patient might be at risk for infection with the SARS-CoV-2 virus that causes COVID-19. Institutional protocols and algorithms that pertain to the evaluation of patients at risk for COVID-19 are in a state of rapid change based on information released by regulatory bodies including the CDC and federal and state organizations. These policies and algorithms were followed during the patient's care in the ED.  Some ED evaluations and interventions may be delayed as a result of limited staffing during and the pandemic.*   Note:  This document was prepared using Dragon voice recognition software and may include unintentional dictation errors.   Paulette Blanch, MD 08/09/20 [id number]

## 2020-08-18 ENCOUNTER — Ambulatory Visit: Payer: Medicare HMO | Admitting: Physician Assistant

## 2020-08-19 ENCOUNTER — Ambulatory Visit: Payer: Medicare HMO | Admitting: Physician Assistant

## 2020-08-30 ENCOUNTER — Other Ambulatory Visit: Payer: Self-pay

## 2020-08-30 ENCOUNTER — Ambulatory Visit
Admission: RE | Admit: 2020-08-30 | Discharge: 2020-08-30 | Disposition: A | Discharge status: Home / Self Care | Payer: Medicare HMO | Source: Ambulatory Visit | Attending: Physician Assistant | Admitting: Physician Assistant

## 2020-08-30 DIAGNOSIS — Z1231 Encounter for screening mammogram for malignant neoplasm of breast: Secondary | ICD-10-CM

## 2020-12-21 ENCOUNTER — Ambulatory Visit: Payer: Medicare HMO | Admitting: Cardiovascular Disease

## 2021-02-06 NOTE — Progress Notes (Signed)
Cardiology Office Note  Date:  02/07/2021   ID:  Brooke Sullivan, DOB 1950-10-09, MRN [id number]  PCP:  Harrison Mons, PA   Chief Complaint  Patient presents with   6 month follow up     Patient c/o racing heartbeats at times. Medications reviewed by the patient verbally.     HPI:  Brooke Sullivan is a 70 y.o. female with a history of anxiety Palpitations, tachycardia in the setting of stress Hypertension family stressors, rescues dogs Smoked age 16 to late 11s Who presents for follow-up of her tachycardia palpitations  Last seen in clinic November 2021  Stress test February 2022 Low risk study, no significant coronary calcification, mild aorta athero  Event monitor May 2018 No significant arrhythmia  Issues with anxiety, sleep issues Financial/husband not working 3 years [place]  Being treated for psoriasis, methotrexate  EKG personally reviewed by myself on todays visit NSR rate 82 bpm no ST or T wave changes  Other past medical hx Covid in Dec 2021 Headaches, nausea, exhausted Got Ab, did not feel better next day  Past 2 weeks, Still with SOB, feels cold,   Left chest pain to neck Started on propranolol as needed, On losartan 1/2 BID, sometimes whole  Fall, hurt shoulder,   She is concerned out heart blockage Would like scan  Some anxiety/stress Rescues dogs,  Works long hours, self-employed  Two-week monitor was performed, 09/2016 Monitor showed normal sinus rhythm 3 short runs of SVT 6 beats Max heart rate 176 bpm  Fm hx of CVA   PMH:   has a past medical history of Arthritis, Chest pain, atypical, Headache(784.0), Hypertension, RLD (ruptured lumbar disc), and Shortness of breath.  PSH:    Past Surgical History:  Procedure Laterality Date   COLONOSCOPY     KNEE ARTHROSCOPY  02/22/2012   Procedure: ARTHROSCOPY KNEE;  Surgeon: Augustin Schooling, MD;  Location: [place];  Service: Orthopedics;  Laterality: Left;  left knee arthroscopy   SPINE  SURGERY      Current Outpatient Medications  Medication Sig Dispense Refill   folic acid (FOLVITE) 1 MG tablet folic acid 1 mg tablet  TAKE 2 TABLETS DAILY ON THE DAYS NOT TAKING METHOTREXATE     ibuprofen (ADVIL,MOTRIN) 200 MG tablet Take 400 mg by mouth every 6 (six) hours as needed.     losartan (COZAAR) 25 MG tablet Take 1 tablet (25 mg total) by mouth daily. 90 tablet 4   meloxicam (MOBIC) 7.5 MG tablet Take 7.5 mg by mouth 2 (two) times daily.     methotrexate (RHEUMATREX) 2.5 MG tablet methotrexate sodium 2.5 mg tablet  TAKE 6 TABLETS (15 MG) ONCE WEEKLY FOR 2 WEEKS THEN INCREASE TO 10 TABLETS (25 MG) ONCE WEEKLY     propranolol (INDERAL) 10 MG tablet TAKE 1 TABLET (10 MG TOTAL) BY MOUTH 3 (THREE) TIMES DAILY AS NEEDED (FOR TACHYCARDIA). 180 tablet 0   STELARA 45 MG/0.5ML SOSY injection Inject into the skin.     zolpidem (AMBIEN) 10 MG tablet Take 1 tablet (10 mg total) by mouth at bedtime as needed. for sleep 30 tablet 0   atorvastatin (LIPITOR) 10 MG tablet Take 10 mg by mouth daily. (Patient not taking: Reported on 02/07/2021)     doxycycline (VIBRAMYCIN) 50 MG capsule Take 2 capsules (100 mg total) by mouth 2 (two) times daily. (Patient not taking: Reported on 02/07/2021) 28 capsule 0   No current facility-administered medications for this visit.    Allergies:  Sulfa antibiotics, Augmentin [amoxicillin-pot clavulanate], Codeine, Naproxen, and Sulfonamide derivatives   Social History:  The patient  reports that she quit smoking about 21 years ago. Her smoking use included cigarettes. She has a 20.00 pack-year smoking history. She has never used smokeless tobacco. She reports current alcohol use of about 7.0 standard drinks per week. She reports that she does not use drugs.   Family History:   family history includes Alcohol abuse in her father; Depression in her mother; Heart failure (age of onset: 41) in her father; Osteoporosis in her mother.    Review of Systems: Review of  Systems  Constitutional: Negative.   HENT: Negative.    Respiratory: Negative.    Cardiovascular: Negative.   Gastrointestinal: Negative.   Musculoskeletal: Negative.   Neurological: Negative.   Psychiatric/Behavioral:  The patient is nervous/anxious.   All other systems reviewed and are negative.  PHYSICAL EXAM: VS:  BP 130/70 (BP Location: Left Arm, Patient Position: Sitting, Cuff Size: Normal)   Pulse 82   Ht '5\' 8"'$  (1.727 m)   Wt 181 lb (82.1 kg)   SpO2 96%   BMI 27.52 kg/m  , BMI Body mass index is 27.52 kg/m. Constitutional:  oriented to person, place, and time. No distress.  HENT:  Head: Grossly normal Eyes:  no discharge. No scleral icterus.  Neck: No JVD, no carotid bruits  Cardiovascular: Regular rate and rhythm, no murmurs appreciated Pulmonary/Chest: Clear to auscultation bilaterally, no wheezes or rails Abdominal: Soft.  no distension.  no tenderness.  Musculoskeletal: Normal range of motion Neurological:  normal muscle tone. Coordination normal. No atrophy Skin: Skin warm and dry Psychiatric: normal affect, pleasant   Recent Labs: No results found for requested labs within last 8760 hours.    Lipid Panel Lab Results  Component Value Date   CHOL 185 09/23/2017   HDL 64 09/23/2017   LDLCALC 107 (H) 09/23/2017   TRIG 72 09/23/2017    Wt Readings from Last 3 Encounters:  02/07/21 181 lb (82.1 kg)  08/08/20 180 lb (81.6 kg)  06/21/20 183 lb 2 oz (83.1 kg)     ASSESSMENT AND PLAN:  Paroxysmal tachycardia (HCC)  Taking propranolol as needed  DYSPNEA - Recommend exercise program  Anxiety Stress at home, Reassurance provided  CHEST TIGHTNESS-PRESSURE-OTHER Myoview earlier 2022, low risk study  Hypertension Blood pressure is well controlled on today's visit. No changes made to the medications.    Total encounter time more than 25 minutes  Greater than 50% was spent in counseling and coordination of care with the patient    Orders Placed  This Encounter  Procedures   EKG 12-Lead      Signed, Esmond Plants, M.D., Ph.D. 02/07/2021  [place], [place]

## 2021-02-07 ENCOUNTER — Encounter: Payer: Self-pay | Admitting: Cardiovascular Disease

## 2021-02-07 ENCOUNTER — Ambulatory Visit: Payer: Medicare HMO | Admitting: Cardiovascular Disease

## 2021-02-07 ENCOUNTER — Other Ambulatory Visit: Payer: Self-pay

## 2021-02-07 VITALS — BP 130/70 | HR 82 | Ht 68.0 in | Wt 181.0 lb

## 2021-02-07 DIAGNOSIS — I10 Essential (primary) hypertension: Secondary | ICD-10-CM

## 2021-02-07 DIAGNOSIS — I479 Paroxysmal tachycardia, unspecified: Secondary | ICD-10-CM | POA: Diagnosis not present

## 2021-02-07 DIAGNOSIS — Z87891 Personal history of nicotine dependence: Secondary | ICD-10-CM

## 2021-02-07 DIAGNOSIS — E782 Mixed hyperlipidemia: Secondary | ICD-10-CM

## 2021-02-07 MED ORDER — PROPRANOLOL HCL 10 MG PO TABS: 10.0000 mg | ORAL_TABLET | Freq: Three times a day (TID) | ORAL | 4 refills | Status: DC | PRN

## 2021-02-07 MED ORDER — LOSARTAN POTASSIUM 25 MG PO TABS: 25.0000 mg | ORAL_TABLET | Freq: Every day | ORAL | 4 refills | Status: DC

## 2021-02-07 NOTE — Patient Instructions (Addendum)
Medication Instructions:  No changes  For stress, take clonazepam/propranolol combo  If you need a refill on your cardiac medications before your next appointment, please call your pharmacy.   Lab work: No new labs needed  Testing/Procedures: No new testing needed  Follow-Up: At [facility], you and your health needs are our priority.  As part of our continuing mission to provide you with exceptional heart care, we have created designated Provider Care Teams.  These Care Teams include your primary Cardiologist (physician) and Advanced Practice Providers (APPs -  Physician Assistants and Nurse Practitioners) who all work together to provide you with the care you need, when you need it.  You will need a follow up appointment as needed  Providers on your designated Care Team:   Murray Hodgkins, NP Christell Faith, PA-C Marrianne Mood, PA-C Cadence Clio, [place]  COVID-19 Vaccine Information can be found at: [email or web address] For questions related to vaccine distribution or appointments, please email [email or web address] or call [phone].

## 2021-11-15 ENCOUNTER — Encounter: Payer: Self-pay | Admitting: Cardiovascular Disease

## 2022-02-05 NOTE — Progress Notes (Unsigned)
Cardiology Office Note  Date:  02/06/2022   ID:  Brooke Sullivan, DOB 1951-01-29, MRN [id number]  PCP:  Harrison Mons, PA   Chief Complaint  Patient presents with   12 month follow up     "Doing well." Medications reviewed by the patient verbally.     HPI:  Brooke Sullivan is a 71 y.o. female with a history of anxiety Palpitations, tachycardia in the setting of stress Hypertension family stressors, rescues dogs Smoked age 77 to late 57s Mild aortic atherosclerosis Who presents for follow-up of her tachycardia palpitations  Last seen in clinic September 2022 Doing well,   Couple episodes of tachycardia, not severe enough to warrant taking propranolol  Still working, cleans, dog sitting, " anything to make a dollar" Left knee pain, followed by orthopedics, needs a cortisone shot  Labs reviewed Total chol 224, LDL 110 Does not want medication , has atorvastatin 10 mg but has not started  Stress test February 2022 Low risk study, no significant coronary calcification, mild aorta athero Cardiac stress CT attenuation correction images pulled up showing mild aortic atherosclerosis  Event monitor May 2018 No significant arrhythmia  Continues to receive treatment for psoriasis, on methotrexate  EKG personally reviewed by myself on todays visit NSR rate 73 bpm no ST or T wave changes  Other past medical hx Covid in Dec 2021 Headaches, nausea, exhausted Got Ab, did not feel better next day  Two-week monitor was performed, 09/2016 Monitor showed normal sinus rhythm 3 short runs of SVT 6 beats Max heart rate 176 bpm  Fm hx of CVA   PMH:   has a past medical history of Arthritis, Chest pain, atypical, Headache(784.0), Hypertension, RLD (ruptured lumbar disc), and Shortness of breath.  PSH:    Past Surgical History:  Procedure Laterality Date   COLONOSCOPY     KNEE ARTHROSCOPY  02/22/2012   Procedure: ARTHROSCOPY KNEE;  Surgeon: Augustin Schooling, MD;  Location: [facility];   Service: Orthopedics;  Laterality: Left;  left knee arthroscopy   SPINE SURGERY      Current Outpatient Medications  Medication Sig Dispense Refill   ibuprofen (ADVIL,MOTRIN) 200 MG tablet Take 400 mg by mouth every 6 (six) hours as needed.     losartan (COZAAR) 25 MG tablet Take 1 tablet (25 mg total) by mouth daily. 90 tablet 4   meloxicam (MOBIC) 7.5 MG tablet Take 7.5 mg by mouth 2 (two) times daily.     propranolol (INDERAL) 10 MG tablet Take 1 tablet (10 mg total) by mouth 3 (three) times daily as needed (for tachycardia). 180 tablet 4   STELARA 45 MG/0.5ML SOSY injection Inject into the skin.     zolpidem (AMBIEN) 10 MG tablet Take 1 tablet (10 mg total) by mouth at bedtime as needed. for sleep 30 tablet 0   methotrexate (RHEUMATREX) 2.5 MG tablet methotrexate sodium 2.5 mg tablet  TAKE 6 TABLETS (15 MG) ONCE WEEKLY FOR 2 WEEKS THEN INCREASE TO 10 TABLETS (25 MG) ONCE WEEKLY (Patient not taking: Reported on 02/06/2022)     No current facility-administered medications for this visit.    Allergies:   Sulfa antibiotics, Augmentin [amoxicillin-pot clavulanate], Codeine, Naproxen, and Sulfonamide derivatives   Social History:  The patient  reports that she quit smoking about 22 years ago. Her smoking use included cigarettes. She has a 20.00 pack-year smoking history. She has never used smokeless tobacco. She reports current alcohol use of about 7.0 standard drinks of alcohol per week. She  reports that she does not use drugs.   Family History:   family history includes Alcohol abuse in her father; Depression in her mother; Heart failure (age of onset: 72) in her father; Osteoporosis in her mother.    Review of Systems: Review of Systems  Constitutional: Negative.   HENT: Negative.    Respiratory: Negative.    Cardiovascular: Negative.   Gastrointestinal: Negative.   Musculoskeletal: Negative.   Neurological: Negative.   Psychiatric/Behavioral: Negative.    All other systems  reviewed and are negative.   PHYSICAL EXAM: VS:  BP 122/62 (BP Location: Left Arm, Patient Position: Sitting, Cuff Size: Normal)   Pulse 73   Ht '5\' 8"'$  (1.727 m)   Wt 185 lb 4 oz (84 kg)   SpO2 96%   BMI 28.17 kg/m  , BMI Body mass index is 28.17 kg/m. Constitutional:  oriented to person, place, and time. No distress.  HENT:  Head: Grossly normal Eyes:  no discharge. No scleral icterus.  Neck: No JVD, no carotid bruits  Cardiovascular: Regular rate and rhythm, no murmurs appreciated Pulmonary/Chest: Clear to auscultation bilaterally, no wheezes or rails Abdominal: Soft.  no distension.  no tenderness.  Musculoskeletal: Normal range of motion Neurological:  normal muscle tone. Coordination normal. No atrophy Skin: Skin warm and dry Psychiatric: normal affect, pleasant  Recent Labs: No results found for requested labs within last 365 days.    Lipid Panel Lab Results  Component Value Date   CHOL 185 09/23/2017   HDL 64 09/23/2017   LDLCALC 107 (H) 09/23/2017   TRIG 72 09/23/2017    Wt Readings from Last 3 Encounters:  02/06/22 185 lb 4 oz (84 kg)  02/07/21 181 lb (82.1 kg)  08/08/20 180 lb (81.6 kg)     ASSESSMENT AND PLAN:  Paroxysmal tachycardia (HCC)  Taking propranolol as needed Rare episodes, likely exacerbated by prior stressors  DYSPNEA - Recommend exercise program Prior stress test for similar symptoms showing no ischemia Symptoms are stable  Anxiety Stress somewhat better at home, no sick animals  CHEST TIGHTNESS-PRESSURE-OTHER Myoview earlier 2022, low risk study Denies chest pain concerning for angina at this time  Hypertension Blood pressure is well controlled on today's visit. No changes made to the medications.  Hyperlipidemia Mild aortic atherosclerosis images pulled up and discussed Although reluctant, she is willing to start the atorvastatin 10 mg daily    Total encounter time more than 30 minutes  Greater than 50% was spent in  counseling and coordination of care with the patient    No orders of the defined types were placed in this encounter.     Signed, Esmond Plants, M.D., Ph.D. 02/06/2022  [place], [place]

## 2022-02-06 ENCOUNTER — Encounter: Payer: Self-pay | Admitting: Cardiovascular Disease

## 2022-02-06 ENCOUNTER — Ambulatory Visit: Payer: Medicare HMO | Attending: Cardiovascular Disease | Admitting: Cardiovascular Disease

## 2022-02-06 VITALS — BP 122/62 | HR 73 | Ht 68.0 in | Wt 185.2 lb

## 2022-02-06 DIAGNOSIS — I10 Essential (primary) hypertension: Secondary | ICD-10-CM

## 2022-02-06 DIAGNOSIS — Z87891 Personal history of nicotine dependence: Secondary | ICD-10-CM | POA: Diagnosis not present

## 2022-02-06 DIAGNOSIS — I479 Paroxysmal tachycardia, unspecified: Secondary | ICD-10-CM

## 2022-02-06 DIAGNOSIS — R0602 Shortness of breath: Secondary | ICD-10-CM

## 2022-02-06 DIAGNOSIS — E782 Mixed hyperlipidemia: Secondary | ICD-10-CM

## 2022-02-06 DIAGNOSIS — R079 Chest pain, unspecified: Secondary | ICD-10-CM

## 2022-02-06 NOTE — Patient Instructions (Addendum)
Medication Instructions:  No changes Start atorvastatin 10 mg daily  If you need a refill on your cardiac medications before your next appointment, please call your pharmacy.    Lab work: No new labs needed   Testing/Procedures: No new testing needed   Follow-Up: At [facility], you and your health needs are our priority.  As part of our continuing mission to provide you with exceptional heart care, we have created designated Provider Care Teams.  These Care Teams include your primary Cardiologist (physician) and Advanced Practice Providers (APPs -  Physician Assistants and Nurse Practitioners) who all work together to provide you with the care you need, when you need it.  You will need a follow up appointment in 12 months  Providers on your designated Care Team:   Murray Hodgkins, NP Christell Faith, PA-C Cadence Kathlen Mody, [place]  COVID-19 Vaccine Information can be found at: [email or web address] For questions related to vaccine distribution or appointments, please email [email or web address] or call [phone].

## 2024-01-21 ENCOUNTER — Ambulatory Visit: Admitting: Podiatry

## 2024-01-27 NOTE — Progress Notes (Unsigned)
 Cardiology Office Note  Date:  01/28/2024   ID:  Brooke Sullivan, DOB June 08, 1950, MRN [id number]  PCP:  Juliane Che, PA   Chief Complaint  Patient presents with   12 month follow up     Patient c/o shortness of breath and tiredness.    HPI:  Brooke Sullivan is a 73 y.o. female with a history of anxiety Palpitations, tachycardia in the setting of stress Hypertension family stressors, rescues dogs Smoked age 7 to late 70s Mild aortic atherosclerosis Who presents for follow-up of her tachycardia palpitations, aortic atherosclerosis  Last seen in clinic September 2023  Doing well,   Several new dog rescues  Overall reports that she feels well Not much tachycardia Does not need propranolol   Son has moved in, helping her fixing things in the house He is up much of the night, interrupting her sleep  Labs reviewed Total cholesterol 180, down 40 points Taking a atorvastatin 10 mg daily  Stress test February 2022 Low risk study, no significant coronary calcification, mild aorta athero  Cardiac stress CT attenuation   mild aortic atherosclerosis  Event monitor May 2018 No significant arrhythmia  Stopped treatment for her psoriasis, medications were expensive  EKG personally reviewed by myself on todays visit EKG Interpretation Date/Time:  Tuesday January 28 2024 09:54:39 EDT Ventricular Rate:  79 PR Interval:  134 QRS Duration:  86 QT Interval:  364 QTC Calculation: 417 R Axis:   41  Text Interpretation: Normal sinus rhythm with sinus arrhythmia Normal ECG When compared with ECG of 25-Aug-1997 13:12, No significant change was found Confirmed by Perla Lye [phone]) on 01/28/2024 10:20:42 AM    Other past medical hx Covid in Dec 2021 Headaches, nausea, exhausted Got Ab, did not feel better next day  Two-week monitor was performed, 09/2016 Monitor showed normal sinus rhythm 3 short runs of SVT 6 beats Max heart rate 176 bpm  Fm hx of CVA   PMH:   has a  past medical history of Arthritis, Chest pain, atypical, Headache(784.0), Hypertension, RLD (ruptured lumbar disc), and Shortness of breath.  PSH:    Past Surgical History:  Procedure Laterality Date   COLONOSCOPY     KNEE ARTHROSCOPY  02/22/2012   Procedure: ARTHROSCOPY KNEE;  Surgeon: Elspeth JONELLE Her, MD;  Location: [facility];  Service: Orthopedics;  Laterality: Left;  left knee arthroscopy   SPINE SURGERY      Current Outpatient Medications  Medication Sig Dispense Refill   atorvastatin (LIPITOR) 10 MG tablet Take 1 tablet (10 mg total) by mouth daily.     ibuprofen (ADVIL,MOTRIN) 200 MG tablet Take 400 mg by mouth every 6 (six) hours as needed.     losartan  (COZAAR ) 25 MG tablet Take 1 tablet (25 mg total) by mouth daily. 90 tablet 4   meloxicam (MOBIC) 7.5 MG tablet Take 7.5 mg by mouth 2 (two) times daily.     propranolol  (INDERAL ) 10 MG tablet Take 1 tablet (10 mg total) by mouth 3 (three) times daily as needed (for tachycardia). 180 tablet 4   zolpidem  (AMBIEN ) 10 MG tablet Take 1 tablet (10 mg total) by mouth at bedtime as needed. for sleep 30 tablet 0   methotrexate (RHEUMATREX) 2.5 MG tablet methotrexate sodium 2.5 mg tablet  TAKE 6 TABLETS (15 MG) ONCE WEEKLY FOR 2 WEEKS THEN INCREASE TO 10 TABLETS (25 MG) ONCE WEEKLY (Patient not taking: Reported on 01/28/2024)     STELARA 45 MG/0.5ML SOSY injection Inject into the skin. (Patient  not taking: Reported on 01/28/2024)     No current facility-administered medications for this visit.    Allergies:   Sulfa antibiotics, Augmentin  [amoxicillin -pot clavulanate], Codeine, Naproxen, and Sulfonamide derivatives   Social History:  The patient  reports that she quit smoking about 24 years ago. Her smoking use included cigarettes. She started smoking about 44 years ago. She has a 20 pack-year smoking history. She has never used smokeless tobacco. She reports current alcohol use of about 7.0 standard drinks of alcohol per week. She reports that she  does not use drugs.   Family History:   family history includes Alcohol abuse in her father; Depression in her mother; Heart failure (age of onset: 75) in her father; Osteoporosis in her mother.    Review of Systems: Review of Systems  Constitutional: Negative.   HENT: Negative.    Respiratory: Negative.    Cardiovascular: Negative.   Gastrointestinal: Negative.   Musculoskeletal: Negative.   Neurological: Negative.   Psychiatric/Behavioral: Negative.    All other systems reviewed and are negative.   PHYSICAL EXAM: VS:  BP 120/70 (BP Location: Left Arm, Patient Position: Sitting, Cuff Size: Normal)   Pulse 79   Ht 5' 8 (1.727 m)   Wt 181 lb 6 oz (82.3 kg)   SpO2 94%   BMI 27.58 kg/m  , BMI Body mass index is 27.58 kg/m. Constitutional:  oriented to person, place, and time. No distress.  HENT:  Head: Normocephalic and atraumatic.  Eyes:  no discharge. No scleral icterus.  Neck: Normal range of motion. Neck supple. No JVD present.  Cardiovascular: Normal rate, regular rhythm, normal heart sounds and intact distal pulses. Exam reveals no gallop and no friction rub. No edema No murmur heard. Pulmonary/Chest: Effort normal and breath sounds normal. No stridor. No respiratory distress.  no wheezes.  no rales.  no tenderness.  Abdominal: Soft.  no distension.  no tenderness.  Musculoskeletal: Normal range of motion.  no  tenderness or deformity.  Neurological:  normal muscle tone. Coordination normal. No atrophy Skin: Skin is warm and dry. No rash noted. not diaphoretic.  Psychiatric:  normal mood and affect. behavior is normal. Thought content normal.     Recent Labs: No results found for requested labs within last 365 days.    Lipid Panel Lab Results  Component Value Date   CHOL 185 09/23/2017   HDL 64 09/23/2017   LDLCALC 107 (H) 09/23/2017   TRIG 72 09/23/2017    Wt Readings from Last 3 Encounters:  01/28/24 181 lb 6 oz (82.3 kg)  02/06/22 185 lb 4 oz (84 kg)   02/07/21 181 lb (82.1 kg)     ASSESSMENT AND PLAN:  Paroxysmal tachycardia (HCC)  Symptoms stable, has not required propranolol  In the past possibly exacerbated by  stressors  DYSPNEA - Prior stress test for similar symptoms showing no ischemia Symptoms stable  Anxiety Son at home with her he is having a divorce Numerous sick foster poodles  CHEST TIGHTNESS- Myoview  earlier 2022, low risk study Denies significant chest pain concerning for angina  Hypertension Blood pressure is well controlled on today's visit. No changes made to the medications.  Hyperlipidemia Mild aortic atherosclerosis on CT scan Tolerating Lipitor 10 daily     Orders Placed This Encounter  Procedures   EKG 12-Lead      Signed, Velinda Lunger, M.D., Ph.D. 01/28/2024  [facility] [place], [place] [phone]

## 2024-01-28 ENCOUNTER — Encounter: Payer: Self-pay | Admitting: Cardiovascular Disease

## 2024-01-28 ENCOUNTER — Ambulatory Visit: Admitting: Podiatry

## 2024-01-28 ENCOUNTER — Ambulatory Visit: Attending: Cardiovascular Disease | Admitting: Cardiovascular Disease

## 2024-01-28 VITALS — BP 120/70 | HR 79 | Ht 68.0 in | Wt 181.4 lb

## 2024-01-28 DIAGNOSIS — I479 Paroxysmal tachycardia, unspecified: Secondary | ICD-10-CM

## 2024-01-28 DIAGNOSIS — E782 Mixed hyperlipidemia: Secondary | ICD-10-CM

## 2024-01-28 DIAGNOSIS — I10 Essential (primary) hypertension: Secondary | ICD-10-CM | POA: Diagnosis not present

## 2024-01-28 DIAGNOSIS — R079 Chest pain, unspecified: Secondary | ICD-10-CM

## 2024-01-28 DIAGNOSIS — R0602 Shortness of breath: Secondary | ICD-10-CM

## 2024-01-28 DIAGNOSIS — Z87891 Personal history of nicotine dependence: Secondary | ICD-10-CM

## 2024-01-28 NOTE — Patient Instructions (Signed)

## 2024-01-31 ENCOUNTER — Ambulatory Visit: Admitting: Podiatry

## 2024-01-31 DIAGNOSIS — D2371 Other benign neoplasm of skin of right lower limb, including hip: Secondary | ICD-10-CM | POA: Diagnosis not present

## 2024-01-31 NOTE — Progress Notes (Signed)
   Chief Complaint  Patient presents with   Foot Pain    New pt- est pt last seen 2021. Thick callous on the lateral side of the right foot. Tender.    Subjective: 73 y.o. female presenting to the office today as a reestablish new patient for evaluation of a symptomatic skin lesion to the lateral column of the right foot.  Associated tenderness in shoes.   Past Medical History:  Diagnosis Date   Arthritis    hands bilateral   Chest pain, atypical    Headache(784.0)    Hypertension    RLD (ruptured lumbar disc)    Shortness of breath    with exertion    Past Surgical History:  Procedure Laterality Date   COLONOSCOPY     KNEE ARTHROSCOPY  02/22/2012   Procedure: ARTHROSCOPY KNEE;  Surgeon: Elspeth JONELLE Her, MD;  Location: [facility] OR;  Service: Orthopedics;  Laterality: Left;  left knee arthroscopy   SPINE SURGERY      Allergies  Allergen Reactions   Sulfa Antibiotics Other (See Comments)    Other Reaction(s): infection   Augmentin  [Amoxicillin -Pot Clavulanate] Diarrhea and Nausea And Vomiting   Codeine Nausea Only and Other (See Comments)    Makes patient sleep for a long time and nauseated   Naproxen Hives, Other (See Comments), Swelling and Rash   Sulfonamide Derivatives Other (See Comments)    Yeast infection.     Objective:  Physical Exam General: Alert and oriented x3 in no acute distress  Dermatology: Hyperkeratotic lesion(s) present overlying fifth metatarsal tubercle of the right foot. Pain on palpation with a central nucleated core noted. Skin is warm, dry and supple bilateral lower extremities. Negative for open lesions or macerations.  Vascular: Palpable pedal pulses bilaterally. No edema or erythema noted. Capillary refill within normal limits.  Neurological: Grossly intact via light touch  Musculoskeletal Exam: Pain on palpation at the keratotic lesion(s) noted. Range of motion within normal limits bilateral. Muscle strength 5/5 in all groups  bilateral.  Assessment: 1.  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  -Salicylic acid applied with a bandaid -Return to the clinic PRN.   Thresa EMERSON Sar, DPM [facility]  Dr. Thresa EMERSON Sar, DPM    [address] [place], [place] [address]                Office [phone]  Fax [phone]

## 2024-03-16 ENCOUNTER — Telehealth: Payer: Self-pay | Admitting: Cardiovascular Disease

## 2024-03-16 MED ORDER — PROPRANOLOL HCL 10 MG PO TABS: 10.0000 mg | ORAL_TABLET | Freq: Three times a day (TID) | ORAL | 2 refills | Status: DC | PRN

## 2024-03-16 NOTE — Telephone Encounter (Signed)
 STAT if patient feels like he/she is going to faint   Are you dizzy, lightheaded, or faint now? No   Have you passed out? No IF YES MOVE TO .SYNCOPECVD  Do you have any other symptoms? No  Have you checked your HR and BP (record if available)? No

## 2024-03-16 NOTE — Telephone Encounter (Signed)
 Patient called with complaints of not feeling good the last few days. She has been experiencing some light-headed spells and some chest tightness that comes and goes. [place] experienced one of these spells and had to go outside of building she was in for some fresh air. She states that she does experience some life stressors and when she does she can feel her blood pressure rising and it often forms a headache. She is prescribed one losartan  daily but admits to taking one in the morning and one at night time most days to help with blood pressure. She states she is going to buy a blood pressure cuff to start taking her blood pressure at home. She is requesting an appt with Dr Gollan. Appt has been made for 11/11 at 11 am.

## 2024-03-16 NOTE — Telephone Encounter (Signed)
 Sent in refill

## 2024-03-16 NOTE — Telephone Encounter (Signed)
 Called patient, no answer. Left voice mail to call back.

## 2024-03-16 NOTE — Telephone Encounter (Signed)
*  STAT* If patient is at the pharmacy, call can be transferred to refill team.   1. Which medications need to be refilled? (please list name of each medication and dose if known) propranolol  (INDERAL ) 10 MG tablet    2. Would you like to learn more about the convenience, safety, & potential cost savings by using the [facility]?    3. Are you open to using the [facility] (Type [facility].  ).   4. Which pharmacy/location (including street and city if local pharmacy) is medication to be sent to? [id number] [address], [place] [phone]    5. Do they need a 30 day or 90 day supply? 90 day

## 2024-03-30 NOTE — Progress Notes (Unsigned)
 Cardiology Office Note  Date:  03/31/2024   ID:  Brooke Sullivan, DOB 1951-04-08, MRN [id number]  PCP:  Juliane Che, PA   Chief Complaint  Patient presents with   Follow-up    Patient c/o palpitations, dizziness and chest tightness x 2 weeks; patient is taking Losartan  25 mg one tablet twice a day x 2 weeks.    HPI:  Brooke Sullivan is a 73 y.o. 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, one dog bit another dog, $800 in vet bills  Tearful at times during today's visit discussing all her recent stress  When aggravated, gets chest tightness Does not have anxiety pill Sometimes discomfort up into neck Aggravation causes her blood pressure to run higher, she has been taking losartan  25 twice daily  Seen by rheumatology Did not tolerate treatment for rheumatoid arthritis Tried lelflunomide 20 mg daily  had side effects, woke up with palpitations  Other past medical history reviewed Stress test February 2022 Low risk study, no significant coronary calcification, mild aorta athero  Cardiac stress CT attenuation   mild aortic atherosclerosis  Event monitor May 2018 No significant arrhythmia  Stopped treatment for her psoriasis, medications were expensive  Covid in Dec 2021 Headaches, nausea, exhausted Got Ab, did not feel better next day  Two-week monitor was performed, 09/2016 Monitor showed normal sinus rhythm 3 short runs of SVT 6 beats Max heart rate 176 bpm  Fm hx of CVA   PMH:   has a past medical history of Arthritis, Chest pain, atypical, Headache(784.0), Hypertension, RLD (ruptured  lumbar disc), and Shortness of breath.  PSH:    Past Surgical History:  Procedure Laterality Date   COLONOSCOPY     KNEE ARTHROSCOPY  02/22/2012   Procedure: ARTHROSCOPY KNEE;  Surgeon: Elspeth JONELLE Her, MD;  Location: [facility] OR;  Service: Orthopedics;  Laterality: Left;  left knee arthroscopy   SPINE SURGERY      Current Outpatient Medications  Medication Sig Dispense Refill   atorvastatin (LIPITOR) 10 MG tablet Take 1 tablet (10 mg total) by mouth daily.     losartan  (COZAAR ) 25 MG tablet Take 1 tablet (25 mg total) by mouth daily. (Patient taking differently: Take 25 mg by mouth in the morning and at bedtime.) 90 tablet 4   meloxicam (MOBIC) 7.5 MG tablet Take 7.5 mg by mouth 2 (two) times daily.     propranolol  (INDERAL ) 10 MG tablet Take 1 tablet (10 mg total) by mouth 3 (three) times daily as needed (for tachycardia). 90 tablet 2   zolpidem  (AMBIEN ) 10 MG tablet Take 1 tablet (10 mg total) by mouth at bedtime as needed. for sleep 30 tablet 0   No current facility-administered medications for this visit.    Allergies:   Sulfa antibiotics, Augmentin  [amoxicillin -pot clavulanate], Codeine, Naproxen, and Sulfonamide derivatives   Social History:  The patient  reports that she quit smoking about 24 years ago. Her smoking use included cigarettes. She started smoking about 44 years ago. She has a 20 pack-year smoking history. She has never used smokeless tobacco. She reports current alcohol use of about 7.0 standard drinks of alcohol per week. She reports that she  does not use drugs.   Family History:   family history includes Alcohol abuse in her father; Depression in her mother; Heart failure (age of onset: 74) in her father; Osteoporosis in her mother.    Review of Systems: Review of Systems  Constitutional: Negative.   HENT: Negative.    Respiratory: Negative.    Cardiovascular: Negative.   Gastrointestinal: Negative.   Musculoskeletal: Negative.   Neurological: Negative.    Psychiatric/Behavioral: Negative.    All other systems reviewed and are negative.   PHYSICAL EXAM: VS:  BP (!) 140/60 (BP Location: Left Arm, Patient Position: Sitting, Cuff Size: Normal)   Pulse 69   Ht 5' 8 (1.727 m)   Wt 180 lb 4 oz (81.8 kg)   SpO2 94%   BMI 27.41 kg/m  , BMI Body mass index is 27.41 kg/m. Constitutional:  oriented to person, place, and time. No distress.  HENT:  Head: Normocephalic and atraumatic.  Eyes:  no discharge. No scleral icterus.  Neck: Normal range of motion. Neck supple. No JVD present.  Cardiovascular: Normal rate, regular rhythm, normal heart sounds and intact distal pulses. Exam reveals no gallop and no friction rub. No edema No murmur heard. Pulmonary/Chest: Effort normal and breath sounds normal. No stridor. No respiratory distress.  no wheezes.  no rales.  no tenderness.  Abdominal: Soft.  no distension.  no tenderness.  Musculoskeletal: Normal range of motion.  no  tenderness or deformity.  Neurological:  normal muscle tone. Coordination normal. No atrophy Skin: Skin is warm and dry. No rash noted. not diaphoretic.  Psychiatric:  normal mood and affect. behavior is normal. Thought content normal.     Recent Labs: No results found for requested labs within last 365 days.    Lipid Panel Lab Results  Component Value Date   CHOL 185 09/23/2017   HDL 64 09/23/2017   LDLCALC 107 (H) 09/23/2017   TRIG 72 09/23/2017    Wt Readings from Last 3 Encounters:  03/31/24 180 lb 4 oz (81.8 kg)  01/28/24 181 lb 6 oz (82.3 kg)  02/06/22 185 lb 4 oz (84 kg)     ASSESSMENT AND PLAN:  Paroxysmal tachycardia (HCC)  Recommend she continue to take propranolol  as needed Suspect symptoms may be exacerbated by  stressors, husband's medical issues, stress with rescue dogs, vet bills  DYSPNEA - Prior stress test for similar symptoms showing no ischemia  Chest tightness Seems to come on when she gets agitated, upset Likely stress related If  symptoms get worse, stress testing could be ordered Myoview  earlier 2022, low risk study  Hypertension Recommend she continue on losartan  25 twice daily  Hyperlipidemia Mild aortic atherosclerosis on CT scan Tolerating Lipitor 10 daily    No orders of the defined types were placed in this encounter.     Signed, Velinda Lunger, M.D., Ph.D. 03/31/2024  [facility] [place], [place] [phone]

## 2024-03-31 ENCOUNTER — Ambulatory Visit: Attending: Cardiovascular Disease | Admitting: Cardiovascular Disease

## 2024-03-31 ENCOUNTER — Encounter: Payer: Self-pay | Admitting: Cardiovascular Disease

## 2024-03-31 VITALS — BP 140/60 | HR 69 | Ht 68.0 in | Wt 180.2 lb

## 2024-03-31 DIAGNOSIS — Z87891 Personal history of nicotine dependence: Secondary | ICD-10-CM

## 2024-03-31 DIAGNOSIS — I10 Essential (primary) hypertension: Secondary | ICD-10-CM | POA: Diagnosis not present

## 2024-03-31 DIAGNOSIS — R0602 Shortness of breath: Secondary | ICD-10-CM

## 2024-03-31 DIAGNOSIS — I479 Paroxysmal tachycardia, unspecified: Secondary | ICD-10-CM

## 2024-03-31 DIAGNOSIS — E782 Mixed hyperlipidemia: Secondary | ICD-10-CM

## 2024-03-31 DIAGNOSIS — R079 Chest pain, unspecified: Secondary | ICD-10-CM

## 2024-03-31 MED ORDER — LOSARTAN POTASSIUM 25 MG PO TABS: 25.0000 mg | ORAL_TABLET | Freq: Two times a day (BID) | ORAL | 3 refills | Status: AC

## 2024-03-31 NOTE — Patient Instructions (Addendum)
 Medication Instructions:   Continue losartan  25 twice a day Propranolol  as needed  If you need a refill on your cardiac medications before your next appointment, please call your pharmacy.   Lab work: No new labs needed  Testing/Procedures: No new testing needed  Follow-Up: At [facility], you and your health needs are our priority.  As part of our continuing mission to provide you with exceptional heart care, we have created designated Provider Care Teams.  These Care Teams include your primary Cardiologist (physician) and Advanced Practice Providers (APPs -  Physician Assistants and Nurse Practitioners) who all work together to provide you with the care you need, when you need it.  You will need a follow up appointment in 12 months  Providers on your designated Care Team:   Lonni Meager, NP Bernardino Bring, PA-C Cadence [place], [place]  COVID-19 Vaccine Information can be found at: [email or web address] For questions related to vaccine distribution or appointments, please email [email or web address] or call [phone].

## 2024-06-10 ENCOUNTER — Other Ambulatory Visit: Payer: Self-pay | Admitting: Cardiovascular Disease
